# Patient Record
Sex: Female | Born: 1990 | Hispanic: Yes | Marital: Single | Smoking: Never smoker
Health system: Southern US, Community
[De-identification: ages and names within clinical notes are randomized; demographics above are authoritative.]

## PROBLEM LIST (undated history)

## (undated) DIAGNOSIS — D649 Anemia, unspecified: Secondary | ICD-10-CM

---

## 2016-10-11 ENCOUNTER — Other Ambulatory Visit: Payer: Self-pay | Admitting: Primary Care

## 2016-10-11 DIAGNOSIS — R1084 Generalized abdominal pain: Secondary | ICD-10-CM

## 2016-10-18 ENCOUNTER — Ambulatory Visit
Admission: RE | Admit: 2016-10-18 | Discharge: 2016-10-18 | Disposition: A | Discharge status: Home / Self Care | Payer: BLUE CROSS/BLUE SHIELD | Source: Ambulatory Visit | Attending: Primary Care | Admitting: Primary Care

## 2016-10-18 DIAGNOSIS — R1084 Generalized abdominal pain: Secondary | ICD-10-CM | POA: Diagnosis present

## 2017-03-05 ENCOUNTER — Emergency Department
Admission: EM | Admit: 2017-03-05 | Discharge: 2017-03-05 | Disposition: A | Discharge status: Home / Self Care | Payer: BLUE CROSS/BLUE SHIELD | Attending: Emergency Medicine | Admitting: Emergency Medicine

## 2017-03-05 ENCOUNTER — Encounter: Payer: Self-pay | Admitting: Emergency Medicine

## 2017-03-05 DIAGNOSIS — Y9389 Activity, other specified: Secondary | ICD-10-CM | POA: Diagnosis not present

## 2017-03-05 DIAGNOSIS — X500XXA Overexertion from strenuous movement or load, initial encounter: Secondary | ICD-10-CM | POA: Diagnosis not present

## 2017-03-05 DIAGNOSIS — Y990 Civilian activity done for income or pay: Secondary | ICD-10-CM | POA: Insufficient documentation

## 2017-03-05 DIAGNOSIS — S4991XA Unspecified injury of right shoulder and upper arm, initial encounter: Secondary | ICD-10-CM | POA: Diagnosis present

## 2017-03-05 DIAGNOSIS — S46919A Strain of unspecified muscle, fascia and tendon at shoulder and upper arm level, unspecified arm, initial encounter: Secondary | ICD-10-CM | POA: Diagnosis not present

## 2017-03-05 DIAGNOSIS — S46911A Strain of unspecified muscle, fascia and tendon at shoulder and upper arm level, right arm, initial encounter: Secondary | ICD-10-CM

## 2017-03-05 DIAGNOSIS — Y9289 Other specified places as the place of occurrence of the external cause: Secondary | ICD-10-CM | POA: Insufficient documentation

## 2017-03-05 LAB — POCT PREGNANCY, URINE: Preg Test, Ur: NEGATIVE

## 2017-03-05 MED ORDER — HYDROCODONE-ACETAMINOPHEN 5-325 MG PO TABS: 1.0000 | ORAL_TABLET | Freq: Four times a day (QID) | ORAL | 0 refills | Status: DC | PRN

## 2017-03-05 MED ORDER — HYDROCODONE-ACETAMINOPHEN 5-325 MG PO TABS
1.0000 | ORAL_TABLET | Freq: Once | ORAL | Status: AC
  Administered 2017-03-05: 1 via ORAL
  Filled 2017-03-05: qty 1

## 2017-03-05 MED ORDER — METHOCARBAMOL 500 MG PO TABS: 500.0000 mg | ORAL_TABLET | Freq: Four times a day (QID) | ORAL | 0 refills | Status: AC

## 2017-03-05 MED ORDER — ETODOLAC 400 MG PO TABS: 400.0000 mg | ORAL_TABLET | Freq: Two times a day (BID) | ORAL | 0 refills | Status: DC

## 2017-03-05 MED ORDER — KETOROLAC TROMETHAMINE 30 MG/ML IJ SOLN
30.0000 mg | Freq: Once | INTRAMUSCULAR | Status: AC
Start: 2017-03-05 — End: 2017-03-05
  Administered 2017-03-05: 30 mg via INTRAMUSCULAR
  Filled 2017-03-05: qty 1

## 2017-03-05 MED ORDER — METHOCARBAMOL 500 MG PO TABS
1000.0000 mg | ORAL_TABLET | Freq: Once | ORAL | Status: AC
  Administered 2017-03-05: 1000 mg via ORAL
  Filled 2017-03-05: qty 2

## 2017-03-05 NOTE — ED Notes (Addendum)
See triage note  Presents with mid back pain  States pain radiates from posterior right shoulder and goes to mid back and chest  Discomfort to chest is caused by taking deep breaths   Does a lot of heavy lifting   Ambulates well   No urinary problems

## 2017-03-05 NOTE — ED Provider Notes (Signed)
[facility] Emergency Department Provider Note  ___________________________________________   First MD Initiated Contact with Patient 03/05/17 1320     (approximate)  I have reviewed the triage vital signs and the nursing notes.   HISTORY  Chief Complaint Back Pain   HPI Tonya Webb is a 26 y.o. female is here with complaint of right upper back pain suggest a. Patient states the pain goes through her right shoulder and right arm and is worse with movement. 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 This is the second time she has had this same muscle injury. Patient states that pain is worse today. She denies any difficulty breathing or any heart issues. Patient has been taking ibuprofen without any relief. Currently she rates her pain as a 10 over 10.   History reviewed. No pertinent past medical history.  There are no active problems to display for this patient.   History reviewed. No pertinent surgical history.  Prior to Admission medications   Medication Sig Start Date End Date Taking? Authorizing Provider  etodolac (LODINE) 400 MG tablet Take 1 tablet (400 mg total) by mouth 2 (two) times daily. With food 03/05/17   Tommi Rumps, PA-C  HYDROcodone-acetaminophen (NORCO/VICODIN) 5-325 MG tablet Take 1 tablet by mouth every 6 (six) hours as needed for moderate pain. 03/05/17   Tommi Rumps, PA-C  methocarbamol (ROBAXIN) 500 MG tablet Take 1 tablet (500 mg total) by mouth 4 (four) times daily. 03/05/17 03/07/17  Tommi Rumps, PA-C    Allergies Patient has no known allergies.  History reviewed. No pertinent family history.  Social History Social History  Substance Use Topics  . Smoking status: Never Smoker  . Smokeless tobacco: Never Used  . Alcohol use No    Review of Systems Constitutional: No fever/chills Cardiovascular: Denies chest pain. Respiratory: Denies shortness of  breath. Gastrointestinal:   No nausea, no vomiting.  Musculoskeletal: Pain in her right upper back and shoulder. Skin: Negative for rash. Neurological: Negative for  focal weakness or numbness. ___________________________________________   PHYSICAL EXAM:  VITAL SIGNS: ED Triage Vitals  Enc Vitals Group     BP 03/05/17 1254 117/81     Pulse Rate 03/05/17 1254 72     Resp 03/05/17 1254 18     Temp 03/05/17 1254 98.1 F (36.7 C)     Temp Source 03/05/17 1254 Oral     SpO2 03/05/17 1254 100 %     Weight 03/05/17 1302 135 lb (61.2 kg)     Height 03/05/17 1302  (1.499 m)     Head Circumference --      Peak Flow --      Pain Score 03/05/17 1302 10     Pain Loc --      Pain Edu? --      Excl. in GC? --    Constitutional: Alert and oriented. Well appearing and in no acute distress. Eyes: Conjunctivae are normal.  Head: Atraumatic. Neck: No stridor.   Cardiovascular: Normal rate, regular rhythm. Grossly normal heart sounds.  Good peripheral circulation. Respiratory: Normal respiratory effort.  No retractions. Lungs CTAB. Musculoskeletal: Examination of the right upper shoulder there is no gross deformity and no difficulty with range of motion passively. No crepitus is noted. There is marked tenderness on palpation of the soft tissues of the trapezius, right rhomboid, and deltoid. Patient is nontender on palpation of the cervical or thoracic spine. Grip strength is equal.  There is no ecchymosis, abrasions or erythema noted.  Neurologic:  Normal speech and language. No gross focal neurologic deficits are appreciated. No gait instability. Skin:  Skin is warm, dry and intact. No ecchymosis, abrasions or erythema noted. Psychiatric: Mood and affect are normal. Speech and behavior are normal.  ____________________________________________   LABS (all labs ordered are listed, but only abnormal results are displayed)  Labs Reviewed  POCT PREGNANCY, URINE  POC URINE PREG, ED    _ PROCEDURES  Procedure(s) performed: None  Procedures  Critical Care performed: No  ____________________________________________   INITIAL IMPRESSION / ASSESSMENT AND PLAN / ED COURSE  Pertinent labs & imaging results that were available during my care of the patient were reviewed by me and considered in my medical decision making (see chart for details).  Patient improved with medications given in the ED. She received Toradol 30 mg IM, Robaxin thousand milligrams by mouth and Norco one tablet by mouth. Patient was able to move her shoulder and had less pain. Patient was given medication to take at home along with a work note for the next 2 days. She is made aware that she could not take any medications other than the etodolac if she plans to drive due to possible drowsiness.  Patient agrees to follow-up with [facility] if any continued problems.   ___________________________________________   FINAL CLINICAL IMPRESSION(S) / ED DIAGNOSES  Final diagnoses:  Muscle strain of right upper arm, initial encounter  Muscle strain of right shoulder region, initial encounter      NEW MEDICATIONS STARTED DURING THIS VISIT:  Discharge Medication List as of 03/05/2017  3:04 PM    START taking these medications   Details  etodolac (LODINE) 400 MG tablet Take 1 tablet (400 mg total) by mouth 2 (two) times daily. With food, Starting Sun 03/05/2017, Print    HYDROcodone-acetaminophen (NORCO/VICODIN) 5-325 MG tablet Take 1 tablet by mouth every 6 (six) hours as needed for moderate pain., Starting Sun 03/05/2017, Print    methocarbamol (ROBAXIN) 500 MG tablet Take 1 tablet (500 mg total) by mouth 4 (four) times daily., Starting Sun 03/05/2017, Until Tue 03/07/2017, Print         Note:  This document was prepared using Dragon voice recognition software and may include unintentional dictation errors.    Tommi Rumps, PA-C 03/05/17 1514    Governor Rooks, MD 03/05/17  [phone]

## 2017-03-05 NOTE — Discharge Instructions (Signed)
Follow-up with your primary care provider at [facility] clinic. Continue medications as directed. You may use ice or heat to your shoulder and upper back as needed for comfort. Do not take Norco for Robaxin and drive or operate machinery. You may take etodolac 400 mg twice a day with food on any day as this does not cause any drowsiness. Routine medications you have a prescription for 2 days only. Today you are given a note to remain out of work until 03/07/17. Limited duty for the next 4 days of no lifting over 25 pounds.

## 2017-03-05 NOTE — ED Triage Notes (Signed)
Pt c/o right upper back pain since yesterday.  Pain also goes through right shoulder and right arm.  All pain worse with movement and extension right arm.  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

## 2019-01-29 ENCOUNTER — Other Ambulatory Visit: Payer: Self-pay

## 2019-01-29 DIAGNOSIS — Z20822 Contact with and (suspected) exposure to covid-19: Secondary | ICD-10-CM

## 2019-01-30 LAB — NOVEL CORONAVIRUS, NAA: SARS-CoV-2, NAA: NOT DETECTED

## 2019-06-14 NOTE — L&D Delivery Note (Signed)
Delivery Note  First Stage: Labor onset: 1100 Augmentation : None Analgesia /Anesthesia intrapartum: None SROM at 1600  Second Stage: Complete dilation at 1555 Onset of pushing at 1555 FHR second stage 145 bpm with moderate variability, accels present, intermittent variables  Tonya Webb presented to L&D in advance labor.  She had a spontaneous urge to push after SROM.  She pushed effectively with contractions for a spontaneous vaginal birth.   Delivery of a viable baby boy on 02/14/2020 at 1606 by CNM Delivery of fetal head in OA position with restitution to LOT. No nuchal cord;  Anterior then posterior shoulders delivered easily with gentle downward traction. Baby placed on mom's chest, and attended to by baby RN Cord double clamped after cessation of pulsation, cut by father of baby.  Cord blood sample collected d/t Rh unknown   Third Stage: Oxytocin bolus started after delivery of infant for hemorrhage prophylaxis  Placenta delivered intact with 3 VC @ 1612 Placenta disposition: discarded Uterine tone firm / bleeding moderate  1st degree perineal laceration identified  Anesthesia for repair: 1% lidocaine for local  Repair with 2-0 chromic CT in usual fashion  Est. Blood Loss (mL): 100  Complications: None  Mom to postpartum.  Baby to Couplet care / Skin to Skin.  Newborn: Information for the patient's newborn:  Tonya Webb, [name] [[id number]]  Live born female  Birth Weight: 7 lb 0.5 oz (3190 g) APGAR: 8, 9  Newborn Delivery   Birth date/time: 02/14/2020 16:06:00 Delivery type: Vaginal, Spontaneous     Feeding planned: breast   ---------- Margaretmary Eddy, CNM Certified Nurse Midwife [place]  [facility]

## 2019-07-25 LAB — OB RESULTS CONSOLE RUBELLA ANTIBODY, IGM: Rubella: NON-IMMUNE/NOT IMMUNE

## 2019-07-25 LAB — OB RESULTS CONSOLE HIV ANTIBODY (ROUTINE TESTING): HIV: NONREACTIVE

## 2019-07-25 LAB — OB RESULTS CONSOLE GC/CHLAMYDIA
Chlamydia: NEGATIVE
Gonorrhea: NEGATIVE

## 2019-07-25 LAB — OB RESULTS CONSOLE VARICELLA ZOSTER ANTIBODY, IGG: Varicella: IMMUNE

## 2019-07-25 LAB — OB RESULTS CONSOLE HEPATITIS B SURFACE ANTIGEN: Hepatitis B Surface Ag: NEGATIVE

## 2019-07-25 LAB — OB RESULTS CONSOLE RPR: RPR: NONREACTIVE

## 2019-09-27 ENCOUNTER — Other Ambulatory Visit (HOSPITAL_COMMUNITY): Payer: Self-pay | Admitting: Primary Care

## 2019-09-27 ENCOUNTER — Other Ambulatory Visit: Payer: Self-pay | Admitting: Primary Care

## 2019-09-27 DIAGNOSIS — Z3482 Encounter for supervision of other normal pregnancy, second trimester: Secondary | ICD-10-CM

## 2019-10-04 ENCOUNTER — Ambulatory Visit
Admission: RE | Admit: 2019-10-04 | Discharge: 2019-10-04 | Disposition: A | Discharge status: Home / Self Care | Payer: Medicaid Other | Source: Ambulatory Visit | Attending: Primary Care | Admitting: Primary Care

## 2019-10-04 ENCOUNTER — Other Ambulatory Visit: Payer: Self-pay

## 2019-10-04 DIAGNOSIS — Z3482 Encounter for supervision of other normal pregnancy, second trimester: Secondary | ICD-10-CM | POA: Diagnosis present

## 2019-11-08 ENCOUNTER — Other Ambulatory Visit: Payer: Self-pay | Admitting: Primary Care

## 2019-11-08 DIAGNOSIS — Z3482 Encounter for supervision of other normal pregnancy, second trimester: Secondary | ICD-10-CM

## 2019-11-21 ENCOUNTER — Ambulatory Visit
Admission: RE | Admit: 2019-11-21 | Discharge: 2019-11-21 | Disposition: A | Discharge status: Home / Self Care | Payer: Medicaid Other | Source: Ambulatory Visit | Attending: Primary Care | Admitting: Primary Care

## 2019-11-21 ENCOUNTER — Other Ambulatory Visit: Payer: Self-pay

## 2019-11-21 DIAGNOSIS — Z3482 Encounter for supervision of other normal pregnancy, second trimester: Secondary | ICD-10-CM

## 2020-01-30 LAB — OB RESULTS CONSOLE GBS: GBS: NEGATIVE

## 2020-02-14 ENCOUNTER — Other Ambulatory Visit: Payer: Self-pay

## 2020-02-14 ENCOUNTER — Inpatient Hospital Stay
Admission: EM | Admit: 2020-02-14 | Discharge: 2020-02-15 | DRG: 807 | Disposition: A | Discharge status: Home / Self Care | Payer: Medicaid Other

## 2020-02-14 DIAGNOSIS — O26893 Other specified pregnancy related conditions, third trimester: Secondary | ICD-10-CM | POA: Diagnosis present

## 2020-02-14 DIAGNOSIS — Z3A39 39 weeks gestation of pregnancy: Secondary | ICD-10-CM | POA: Diagnosis not present

## 2020-02-14 DIAGNOSIS — Z23 Encounter for immunization: Secondary | ICD-10-CM

## 2020-02-14 DIAGNOSIS — Z20822 Contact with and (suspected) exposure to covid-19: Secondary | ICD-10-CM | POA: Diagnosis present

## 2020-02-14 LAB — TYPE AND SCREEN
ABO/RH(D): A POS
Antibody Screen: NEGATIVE

## 2020-02-14 LAB — CBC
HCT: 36.5 % (ref 36.0–46.0)
Hemoglobin: 13 g/dL (ref 12.0–15.0)
MCH: 29.6 pg (ref 26.0–34.0)
MCHC: 35.6 g/dL (ref 30.0–36.0)
MCV: 83.1 fL (ref 80.0–100.0)
Platelets: 181 10*3/uL (ref 150–400)
RBC: 4.39 MIL/uL (ref 3.87–5.11)
RDW: 13.8 % (ref 11.5–15.5)
WBC: 12.6 10*3/uL — ABNORMAL HIGH (ref 4.0–10.5)
nRBC: 0 % (ref 0.0–0.2)

## 2020-02-14 LAB — ABO/RH: ABO/RH(D): A POS

## 2020-02-14 LAB — SARS CORONAVIRUS 2 BY RT PCR (HOSPITAL ORDER, PERFORMED IN ~~LOC~~ HOSPITAL LAB): SARS Coronavirus 2: NEGATIVE

## 2020-02-14 MED ORDER — COCONUT OIL OIL: 1.0000 "application " | TOPICAL_OIL | Status: DC | PRN

## 2020-02-14 MED ORDER — ACETAMINOPHEN 500 MG PO TABS
1000.0000 mg | ORAL_TABLET | Freq: Four times a day (QID) | ORAL | Status: DC | PRN
  Administered 2020-02-14: 1000 mg via ORAL
  Filled 2020-02-14: qty 2

## 2020-02-14 MED ORDER — OXYTOCIN-SODIUM CHLORIDE 30-0.9 UT/500ML-% IV SOLN
INTRAVENOUS | Status: AC
  Administered 2020-02-14: 333 mL via INTRAVENOUS
  Filled 2020-02-14: qty 1000

## 2020-02-14 MED ORDER — BENZOCAINE-MENTHOL 20-0.5 % EX AERO
INHALATION_SPRAY | CUTANEOUS | Status: AC
  Filled 2020-02-14: qty 56

## 2020-02-14 MED ORDER — LACTATED RINGERS IV SOLN: INTRAVENOUS | Status: DC

## 2020-02-14 MED ORDER — DOCUSATE SODIUM 100 MG PO CAPS
100.0000 mg | ORAL_CAPSULE | Freq: Two times a day (BID) | ORAL | Status: DC
  Administered 2020-02-15: 100 mg via ORAL
  Filled 2020-02-14 (×3): qty 1

## 2020-02-14 MED ORDER — IBUPROFEN 600 MG PO TABS
600.0000 mg | ORAL_TABLET | Freq: Four times a day (QID) | ORAL | Status: DC
  Administered 2020-02-14 – 2020-02-15 (×4): 600 mg via ORAL
  Filled 2020-02-14 (×4): qty 1

## 2020-02-14 MED ORDER — ACETAMINOPHEN 500 MG PO TABS: 1000.0000 mg | ORAL_TABLET | Freq: Four times a day (QID) | ORAL | Status: DC | PRN

## 2020-02-14 MED ORDER — LIDOCAINE HCL (PF) 1 % IJ SOLN
INTRAMUSCULAR | Status: AC
  Administered 2020-02-14: 30 mL via SUBCUTANEOUS
  Filled 2020-02-14: qty 30

## 2020-02-14 MED ORDER — AMMONIA AROMATIC IN INHA
RESPIRATORY_TRACT | Status: AC
  Filled 2020-02-14: qty 10

## 2020-02-14 MED ORDER — SOD CITRATE-CITRIC ACID 500-334 MG/5ML PO SOLN: 30.0000 mL | ORAL | Status: DC | PRN

## 2020-02-14 MED ORDER — WITCH HAZEL-GLYCERIN EX PADS: 1.0000 "application " | MEDICATED_PAD | CUTANEOUS | Status: DC

## 2020-02-14 MED ORDER — ONDANSETRON HCL 4 MG PO TABS: 4.0000 mg | ORAL_TABLET | ORAL | Status: DC | PRN

## 2020-02-14 MED ORDER — DIBUCAINE (PERIANAL) 1 % EX OINT: 1.0000 "application " | TOPICAL_OINTMENT | CUTANEOUS | Status: DC | PRN

## 2020-02-14 MED ORDER — MISOPROSTOL 200 MCG PO TABS
ORAL_TABLET | ORAL | Status: AC
  Filled 2020-02-14: qty 4

## 2020-02-14 MED ORDER — ONDANSETRON HCL 4 MG/2ML IJ SOLN: 4.0000 mg | INTRAMUSCULAR | Status: DC | PRN

## 2020-02-14 MED ORDER — OXYTOCIN BOLUS FROM INFUSION: 333.0000 mL | Freq: Once | INTRAVENOUS | Status: AC

## 2020-02-14 MED ORDER — OXYTOCIN 10 UNIT/ML IJ SOLN
INTRAMUSCULAR | Status: AC
  Filled 2020-02-14: qty 2

## 2020-02-14 MED ORDER — FENTANYL CITRATE (PF) 100 MCG/2ML IJ SOLN: 50.0000 ug | INTRAMUSCULAR | Status: DC | PRN

## 2020-02-14 MED ORDER — PRENATAL MULTIVITAMIN CH
1.0000 | ORAL_TABLET | Freq: Every day | ORAL | Status: DC
  Administered 2020-02-15: 1 via ORAL
  Filled 2020-02-14: qty 1

## 2020-02-14 MED ORDER — SIMETHICONE 80 MG PO CHEW: 80.0000 mg | CHEWABLE_TABLET | ORAL | Status: DC | PRN

## 2020-02-14 MED ORDER — DIPHENHYDRAMINE HCL 25 MG PO CAPS: 25.0000 mg | ORAL_CAPSULE | Freq: Four times a day (QID) | ORAL | Status: DC | PRN

## 2020-02-14 MED ORDER — ONDANSETRON HCL 4 MG/2ML IJ SOLN: 4.0000 mg | Freq: Four times a day (QID) | INTRAMUSCULAR | Status: DC | PRN

## 2020-02-14 MED ORDER — OXYTOCIN-SODIUM CHLORIDE 30-0.9 UT/500ML-% IV SOLN
2.5000 [IU]/h | INTRAVENOUS | Status: DC
  Administered 2020-02-14: 2.5 [IU]/h via INTRAVENOUS

## 2020-02-14 MED ORDER — LIDOCAINE HCL (PF) 1 % IJ SOLN: 30.0000 mL | INTRAMUSCULAR | Status: AC | PRN

## 2020-02-14 MED ORDER — BENZOCAINE-MENTHOL 20-0.5 % EX AERO: 1.0000 "application " | INHALATION_SPRAY | CUTANEOUS | Status: DC | PRN

## 2020-02-14 MED ORDER — LACTATED RINGERS IV SOLN: 500.0000 mL | INTRAVENOUS | Status: DC | PRN

## 2020-02-14 NOTE — H&P (Signed)
OB History & Physical   History of Present Illness:  Chief Complaint:   HPI:  Tonya Webb is a 29 y.o. G33P2002 female at [redacted]w[redacted]d dated by patient statement.  She presents to L&D for active labor  Reports active fetal movement  Contractions: every 2 to 3 minutes, started at 11am  LOF/SROM: denies  Vaginal bleeding: bloody show   Pregnancy Issues: 1. Prenatal records not available   Patient Active Problem List   Diagnosis Date Noted  . Labor and delivery indication for care or intervention 02/14/2020     Maternal Medical History:  History reviewed. No pertinent past medical history.  History reviewed. No pertinent surgical history.  No Known Allergies  Prior to Admission medications   Medication Sig Start Date End Date Taking? Authorizing Provider  etodolac (LODINE) 400 MG tablet Take 1 tablet (400 mg total) by mouth 2 (two) times daily. With food 03/05/17   Tommi Rumps, PA-C  HYDROcodone-acetaminophen (NORCO/VICODIN) 5-325 MG tablet Take 1 tablet by mouth every 6 (six) hours as needed for moderate pain. 03/05/17   Tommi Rumps, PA-C     Prenatal care site:  [facility]  Social History: She  reports that she has never smoked. She has never used smokeless tobacco. She reports that she does not drink alcohol and does not use drugs.  Family History: family history is not on file.   Review of Systems: A full review of systems was performed and negative except as noted in the HPI.     Physical Exam:  Vital Signs: BP 111/77   Pulse 78   Temp 98.4 F (36.9 C) (Oral)   Resp 18   Ht 4\' 11"  (1.499 m)   Wt 71.2 kg   Breastfeeding Unknown   BMI 31.71 kg/m  Physical Exam  General: no acute distress.  HEENT: normocephalic, atraumatic Heart: regular rate & rhythm.  No murmurs/rubs/gallops Lungs: clear to auscultation bilaterally, normal respiratory effort Abdomen: soft, gravid, non-tender;  EFW: 6 1/2 lbs  Pelvic:   External: Normal external female  genitalia  Cervix: 10/100/+2/BBOW   Extremities: non-tender, symmetric, no edema bilaterally.  DTRs: 2+/2+  Neurologic: Alert & oriented x 3.    Results for orders placed or performed during the hospital encounter of 02/14/20 (from the past 24 hour(s))  CBC     Status: Abnormal   Collection Time: 02/14/20  3:57 PM  Result Value Ref Range   WBC 12.6 (H) 4.0 - 10.5 K/uL   RBC 4.39 3.87 - 5.11 MIL/uL   Hemoglobin 13.0 12.0 - 15.0 g/dL   HCT 04/15/20 36 - 46 %   MCV 83.1 80.0 - 100.0 fL   MCH 29.6 26.0 - 34.0 pg   MCHC 35.6 30.0 - 36.0 g/dL   RDW 60.1 09.3 - 23.5 %   Platelets 181 150 - 400 K/uL   nRBC 0.0 0.0 - 0.2 %  Type and screen [facility]     Status: None (Preliminary result)   Collection Time: 02/14/20  3:57 PM  Result Value Ref Range   ABO/RH(D) PENDING    Antibody Screen PENDING    Sample Expiration      02/17/2020,2359 Performed at [facility], [address], [place], [place]     Pertinent Results:  Prenatal Labs: Prenatal labs not available - labs pending    FHT: Baseline: 145 bpm, Variability: moderate, Accelerations: present and Decelerations: Variable: intermittent  TOCO: regular, every 2-3 minutes   Cephalic by  [place] and SVE   No results found.  Assessment:  Tonya Webb is a 29 y.o. G54P2002 female at [redacted]w[redacted]d with active labor .   Plan:  1. Admit to Labor & Delivery; consents reviewed and obtained - Covid admission screen  - Prep room for immanent delivery   2. Fetal Well being  - Fetal Tracing:  - Group B Streptococcus ppx not indicated: GBS neg - Presentation: cephalic confirmed by SVE   3. Routine OB: - Prenatal labs reviewed, as above - Rh unknown - ABO pending  - CBC, T&S, RPR on admit - Prenatal labs not available, has received prenatal care from [facility]  - Clear fluids, IVF  4. Monitoring of labor  -  Contractions monitored with external toco -  Pelvis adequate for trial of  labor  -  Plan for expectant management  -  Plan for  continuous fetal monitoring -  Maternal pain control as desired; planning unmedicated labor support options  - Anticipate vaginal delivery  5. Post Partum Planning: - Infant feeding: breast - Contraception: TBD  Gustavo Lah, CNM 02/14/20 4:58 PM  Margaretmary Eddy, CNM Certified Nurse Midwife [place]  [facility]

## 2020-02-14 NOTE — Discharge Summary (Signed)
Obstetrical Discharge Summary  Patient Name: Tonya Webb DOB: [birth year] MRN: [id number]  Date of Admission: 02/14/2020 Date of Delivery: 02/14/2020 Delivered by: Drinda Butts, CNM  Date of Discharge: 02/15/2020  Primary OB: [facility] LMP:No LMP recorded. EDC Estimated Date of Delivery: 02/20/20 Gestational Age at Delivery: [redacted]w[redacted]d  Antepartum complications:  1. Prenatal records not available - able to obtain labs from [facility]  Admitting Diagnosis: active labor  Secondary Diagnosis: Patient Active Problem List   Diagnosis Date Noted  . Labor and delivery indication for care or intervention 02/14/2020    Augmentation: N/A Complications: None Intrapartum complications/course: [place]ern[place]cepresented to L&D in advance labor.  She had a spontaneous urge to push after SROM.   Delivery Type: spontaneous vaginal delivery Anesthesia: None  Placenta: spontaneous Laceration: 1st degree perineal with repair  Episiotomy: none Newborn Data: Live born female  Birth Weight:  7lbs 0.5oz (3190g) APGAR: 8, 9  Newborn Delivery   Birth date/time: 02/14/2020 16:06:00 Delivery type: Vaginal, Spontaneous     Postpartum Procedures: none  Edinburgh:  Edinburgh Postnatal Depression Scale Screening Tool 02/15/2020  I have been able to laugh and see the funny side of things. 0  I have looked forward with enjoyment to things. 0  I have blamed myself unnecessarily when things went wrong. 1  I have been anxious or worried for no good reason. 1  I have felt scared or panicky for no good reason. 1  Things have been getting on top of me. 1  I have been so unhappy that I have had difficulty sleeping. 1  I have felt sad or miserable. 0  I have been so unhappy that I have been crying. 1  The thought of harming myself has occurred to me. 0  Edinburgh Postnatal Depression Scale Total 6     Post partum course:  Patient had an uncomplicated postpartum course.  By time of discharge on PPD#1, her pain  was controlled on oral pain medications; she had appropriate lochia and was ambulating, voiding without difficulty and tolerating regular diet.  She was deemed stable for discharge to home.    Discharge Physical Exam:  BP 102/74 (BP Location: Right Arm)   Pulse 73   Temp 98.3 F (36.8 C) (Oral)   Resp 18   Ht '4\' 11"'  (1.499 m)   Wt 71.2 kg   SpO2 100%   Breastfeeding Unknown   BMI 31.71 kg/m   General: NAD CV: RRR Pulm: CTABL, nl effort ABD: s/nd/nt, fundus firm and below the umbilicus Lochia: moderate Perineum:minimal edema/repair well approximated DVT Evaluation: LE non-ttp, no evidence of DVT on exam.  Hemoglobin  Date Value Ref Range Status  02/15/2020 11.5 (L) 12.0 - 15.0 g/dL Final   HCT  Date Value Ref Range Status  02/15/2020 33.4 (L) 36 - 46 % Final     Disposition: stable, discharge to home. Baby Feeding: breastmilk Baby Disposition: home with mom  Rh Immune globulin given: Rh pos Rubella vaccine given: non-immune - MMR postpartum  Varivax vaccine given: immune Flu vaccine given in AP or PP setting: due in season Tdap vaccine given in AP or PP setting: Unknown   Contraception: BTL - will complete 4 weeks postpartum   Prenatal Labs:  Blood type/Rh A pos  Antibody screen neg  Rubella Non-immune  Varicella Immune  RPR NR  HBsAg Neg  HIV NR  GC neg  Chlamydia neg  Genetic screening Unknown   1 hour GTT Unknown   3 hour  GTT Unknown   GBS Negative    Plan:  Tonya Webb was discharged to home in good condition. Follow-up appointment with Dr. Shary Decamp for pre-operative for bilateral tubal ligation in 2 weeks.  Follow-up with delivering provider in 6 weeks.  Discharge Medications: Allergies as of 02/15/2020   No Known Allergies     Medication List    TAKE these medications   acetaminophen 500 MG tablet Commonly known as: TYLENOL Take 2 tablets (1,000 mg total) by mouth every 6 (six) hours as needed (for pain scale < 4).   docusate  sodium 100 MG capsule Commonly known as: COLACE Take 1 capsule (100 mg total) by mouth 2 (two) times daily.   ibuprofen 600 MG tablet Commonly known as: ADVIL Take 1 tablet (600 mg total) by mouth every 6 (six) hours.   prenatal multivitamin Tabs tablet Take 1 tablet by mouth daily at 12 noon.   witch hazel-glycerin pad Commonly known as: TUCKS Apply 1 application topically continuous.        Follow-up Information    Benjaman Kindler, MD. Schedule an appointment as soon as possible for a visit in 2 week(s).   Specialty: Obstetrics and Gynecology Why: for pre-op appointment for BTL  Contact information: [place] [address] [phone]        Minda Meo, CNM. Schedule an appointment as soon as possible for a visit in 6 week(s).   Specialty: Certified Nurse Midwife Why: postpartum visit  Contact information: [place] [address] [phone]               Signed: Francetta Found, CNM 02/15/2020 1:17 PM

## 2020-02-15 LAB — CBC
HCT: 33.4 % — ABNORMAL LOW (ref 36.0–46.0)
Hemoglobin: 11.5 g/dL — ABNORMAL LOW (ref 12.0–15.0)
MCH: 29.8 pg (ref 26.0–34.0)
MCHC: 34.4 g/dL (ref 30.0–36.0)
MCV: 86.5 fL (ref 80.0–100.0)
Platelets: 139 10*3/uL — ABNORMAL LOW (ref 150–400)
RBC: 3.86 MIL/uL — ABNORMAL LOW (ref 3.87–5.11)
RDW: 14 % (ref 11.5–15.5)
WBC: 11.7 10*3/uL — ABNORMAL HIGH (ref 4.0–10.5)
nRBC: 0 % (ref 0.0–0.2)

## 2020-02-15 LAB — RPR: RPR Ser Ql: NONREACTIVE

## 2020-02-15 MED ORDER — MEASLES, MUMPS & RUBELLA VAC IJ SOLR
0.5000 mL | Freq: Once | INTRAMUSCULAR | Status: AC
  Administered 2020-02-15: 0.5 mL via SUBCUTANEOUS
  Filled 2020-02-15 (×2): qty 0.5

## 2020-02-15 MED ORDER — IBUPROFEN 600 MG PO TABS: 600.0000 mg | ORAL_TABLET | Freq: Four times a day (QID) | ORAL | 0 refills | Status: DC

## 2020-02-15 MED ORDER — PRENATAL MULTIVITAMIN CH: 1.0000 | ORAL_TABLET | Freq: Every day | ORAL | Status: AC

## 2020-02-15 MED ORDER — DOCUSATE SODIUM 100 MG PO CAPS: 100.0000 mg | ORAL_CAPSULE | Freq: Two times a day (BID) | ORAL | 0 refills | Status: AC

## 2020-02-15 MED ORDER — WITCH HAZEL-GLYCERIN EX PADS: 1.0000 "application " | MEDICATED_PAD | CUTANEOUS | 12 refills | Status: AC

## 2020-02-15 MED ORDER — ACETAMINOPHEN 500 MG PO TABS: 1000.0000 mg | ORAL_TABLET | Freq: Four times a day (QID) | ORAL | 0 refills | Status: DC | PRN

## 2020-02-15 NOTE — Discharge Instructions (Signed)
Vaginal Delivery, Care After Refer to this sheet in the next few weeks. These discharge instructions provide you with information on caring for yourself after delivery. Your caregiver may also give you specific instructions. Your treatment has been planned according to the most current medical practices available, but problems sometimes occur. Call your caregiver if you have any problems or questions after you go home. HOME CARE INSTRUCTIONS 1. Take over-the-counter or prescription medicines only as directed by your caregiver or pharmacist. 2. Do not drink alcohol, especially if you are breastfeeding or taking medicine to relieve pain. 3. Do not smoke tobacco. 4. Continue to use good perineal care. Good perineal care includes: 1. Wiping your perineum from back to front 2. Keeping your perineum clean. 3. You can do sitz baths twice a day, to help keep this area clean 5. Do not use tampons, douche or have sex until your caregiver says it is okay. 6. Shower only and avoid sitting in submerged water, aside from sitz baths 7. Wear a well-fitting bra that provides breast support. 8. Eat healthy foods. 9. Drink enough fluids to keep your urine clear or pale yellow. 10. Eat high-fiber foods such as whole grain cereals and breads, brown rice, beans, and fresh fruits and vegetables every day. These foods may help prevent or relieve constipation. 11. Avoid constipation with high fiber foods or medications, such as miralax or metamucil 12. Follow your caregiver's recommendations regarding resumption of activities such as climbing stairs, driving, lifting, exercising, or traveling. 13. Talk to your caregiver about resuming sexual activities. Resumption of sexual activities is dependent upon your risk of infection, your rate of healing, and your comfort and desire to resume sexual activity. 14. Try to have someone help you with your household activities and your newborn for at least a few days after you leave  the hospital. 15. Rest as much as possible. Try to rest or take a nap when your newborn is sleeping. 16. Increase your activities gradually. 17. Keep all of your scheduled postpartum appointments. It is very important to keep your scheduled follow-up appointments. At these appointments, your caregiver will be checking to make sure that you are healing physically and emotionally. SEEK MEDICAL CARE IF:   You are passing large clots from your vagina. Save any clots to show your caregiver.  You have a foul smelling discharge from your vagina.  You have trouble urinating.  You are urinating frequently.  You have pain when you urinate.  You have a change in your bowel movements.  You have increasing redness, pain, or swelling near your vaginal incision (episiotomy) or vaginal tear.  You have pus draining from your episiotomy or vaginal tear.  Your episiotomy or vaginal tear is separating.  You have painful, hard, or reddened breasts.  You have a severe headache.  You have blurred vision or see spots.  You feel sad or depressed.  You have thoughts of hurting yourself or your newborn.  You have questions about your care, the care of your newborn, or medicines.  You are dizzy or light-headed.  You have a rash.  You have nausea or vomiting.  You were breastfeeding and have not had a menstrual period within 12 weeks after you stopped breastfeeding.  You are not breastfeeding and have not had a menstrual period by the 12th week after delivery.  You have a fever. SEEK IMMEDIATE MEDICAL CARE IF:   You have persistent pain.  You have chest pain.  You have shortness of breath.    You faint.  You have leg pain.  You have stomach pain.  Your vaginal bleeding saturates two or more sanitary pads in 1 hour. MAKE SURE YOU:   Understand these instructions.  Will watch your condition.  Will get help right away if you are not doing well or get worse. Document Released:  05/27/2000 Document Revised: 10/14/2013 Document Reviewed: 01/25/2012 ExitCare Patient Information 2015 [employer]. This information is not intended to replace advice given to you by your health care provider. Make sure you discuss any questions you have with your health care provider.  Sitz Bath A sitz bath is a warm water bath taken in the sitting position. The water covers only the hips and butt (buttocks). We recommend using one that fits in the toilet, to help with ease of use and cleanliness. It may be used for either healing or cleaning purposes. Sitz baths are also used to relieve pain, itching, or muscle tightening (spasms). The water may contain medicine. Moist heat will help you heal and relax.  HOME CARE  Take 3 to 4 sitz baths a day. 18. Fill the bathtub half-full with warm water. 19. Sit in the water and open the drain a little. 20. Turn on the warm water to keep the tub half-full. Keep the water running constantly. 21. Soak in the water for 15 to 20 minutes. 22. After the sitz bath, pat the affected area dry. GET HELP RIGHT AWAY IF: You get worse instead of better. Stop the sitz baths if you get worse. MAKE SURE YOU:  Understand these instructions.  Will watch your condition.  Will get help right away if you are not doing well or get worse. Document Released: 07/07/2004 Document Revised: 02/22/2012 Document Reviewed: 09/27/2010 ExitCare Patient Information 2015 [employer]. This information is not intended to replace advice given to you by your health care provider. Make sure you discuss any questions you have with your health care provider.    

## 2020-02-15 NOTE — Progress Notes (Signed)
Discharge instructions, prescriptions, education, and appointments given and explained. Pt verbalized understanding with no further questions. Pt wheeled to personal vehicle with infant via staff.

## 2020-02-15 NOTE — Progress Notes (Signed)
Post Partum Day 1 Subjective: Doing well, no complaints.  Tolerating regular diet, pain with PO meds, voiding and ambulating without difficulty.  No CP SOB Fever,Chills, N/V or leg pain; denies nipple or breast pain, no HA change of vision, RUQ/epigastric pain  Objective: BP 102/74 (BP Location: Right Arm)   Pulse 73   Temp 98.3 F (36.8 C) (Oral)   Resp 18   Ht _0  (1.499 m)   Wt 71.2 kg   SpO2 100%   Breastfeeding Unknown   BMI 31.71 kg/m    Physical Exam:  General: NAD Breasts: soft/nontender CV: RRR Pulm: nl effort, CTABL Abdomen: soft, NT, BS x 4 Perineum: minimal edema Lochia: small Uterine Fundus: fundus firm and 1 fb below umbilicus DVT Evaluation: no cords, ttp LEs   Recent Labs    02/14/20 1557 02/15/20 0451  HGB 13.0 11.5*  HCT 36.5 33.4*  WBC 12.6* 11.7*  PLT 181 139*    Assessment/Plan: 29 y.o. G3P3003 postpartum day # 1  - Continue routine PP care - Lactation consult prn.  - Discussed contraceptive options including implant, IUDs hormonal and non-hormonal, injection, pills/ring/patch, condoms, and NFP. Desires BTL to be done.  - Immunization status: Needs MMR prior to DC   Disposition: Does desire Dc home today.     Francetta Found, CNM 02/15/2020  12:19 PM

## 2020-02-28 NOTE — H&P (Signed)
[name] is a 29 y.o. female here for L/S BTL  Pt is new to our clinic . Desires BTL . 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  Past Medical History:  has no past medical history on file.  Past Surgical History:  has no past surgical history on file. Family History: family history is not on file. Social History:  reports that she has never smoked. She has never used smokeless tobacco. She reports previous alcohol use. OB/GYN History:          OB History    Gravida  3   Para  3   Term      Preterm      AB  0   Living  3     SAB      TAB      Ectopic      Molar      Multiple      Live Births  3          Allergies: has No Known Allergies. Medications:  Current Outpatient Medications:  .  HYDROcodone-acetaminophen (NORCO) 5-325 mg tablet, Take 1 tablet by mouth every 4 (four) hours as needed for Pain POST OP 1-2 TABLETS EVERY 4 -6 HOURS PRN, Disp: 15 tablet, Rfl: 0 .  ibuprofen (MOTRIN) 800 MG tablet, Take 1 tablet (800 mg total) by mouth every 8 (eight) hours as needed for Pain, Disp: 30 tablet, Rfl: 1 .  ondansetron (ZOFRAN) 8 MG tablet, Take 1 tablet (8 mg total) by mouth every 8 (eight) hours as needed for Nausea, Disp: 10 tablet, Rfl: 0  Review of Systems: General:                      No fatigue or weight loss Eyes:                           No vision changes Ears:                            No hearing difficulty Respiratory:                No cough or shortness of breath Pulmonary:                  No asthma or shortness of breath Cardiovascular:           No chest pain, palpitations, dyspnea on exertion Gastrointestinal:          No abdominal bloating, chronic diarrhea, constipations, masses, pain or hematochezia Genitourinary:             No hematuria, dysuria, abnormal vaginal discharge, pelvic pain, Menometrorrhagia Lymphatic:                   No swollen lymph nodes Musculoskeletal:         No muscle  weakness Neurologic:                  No extremity weakness, syncope, seizure disorder Psychiatric:                  No history of depression, delusions or suicidal/homicidal ideation    Exam:      Vitals:   02/28/20 1438  BP: 123/88  Pulse: 64    Body mass index is 27.06 kg/m.  WDWN hispanic  female in NAD   Lungs: CTA  CV : RRR without murmur    Neck:  no thyromegaly Abdomen: soft , no mass, normal active bowel sounds,  non-tender, no rebound tenderness Pelvic: tanner stage 5 ,  External genitalia: vulva /labia no lesions Urethra: no prolapse Vagina: normal physiologic d/c Cervix: no lesions, no cervical motion tenderness   Uterus: normal size shape and contour, non-tender Adnexa: no mass,  non-tender   Rectovaginal:   Impression:   The encounter diagnosis was Contraceptive education.    Plan:   After discussion pt opts for elective L/S BTL - falope . Benefits and risks to surgery: The proposed benefit of the surgery has been discussed with the patient. The possible risks include, but are not limited to: organ injury to the bowel , bladder, ureters, and major blood vessels and nerves. There is a possibility of additional surgeries resulting from these injuries. There is also the risk of blood transfusion and the need to receive blood products during or after the procedure which may rarely lead to HIV or Hepatitis C infection. There is a risk of developing a deep venous thrombosis or a pulmonary embolism . There is the possibility of wound infection and also anesthetic complications, even the rare possibility of death. The patient understands these risks and wishes to proceed. All questions have been answered and the consent has been signed.     No follow-ups on file.  Vilma Prader, MD

## 2020-03-06 ENCOUNTER — Other Ambulatory Visit: Payer: Self-pay

## 2020-03-06 ENCOUNTER — Inpatient Hospital Stay: Admission: RE | Admit: 2020-03-06 | Payer: Medicaid Other | Source: Ambulatory Visit

## 2020-03-06 ENCOUNTER — Other Ambulatory Visit
Admission: RE | Admit: 2020-03-06 | Discharge: 2020-03-06 | Disposition: A | Discharge status: Home / Self Care | Payer: Medicaid Other | Source: Ambulatory Visit | Attending: Neurosurgery | Admitting: Neurosurgery

## 2020-03-06 DIAGNOSIS — Z01812 Encounter for preprocedural laboratory examination: Secondary | ICD-10-CM | POA: Diagnosis not present

## 2020-03-06 HISTORY — DX: Anemia, unspecified: D64.9

## 2020-03-06 LAB — BASIC METABOLIC PANEL
Anion gap: 9 (ref 5–15)
BUN: 7 mg/dL (ref 6–20)
CO2: 25 mmol/L (ref 22–32)
Calcium: 9.4 mg/dL (ref 8.9–10.3)
Chloride: 103 mmol/L (ref 98–111)
Creatinine, Ser: 0.69 mg/dL (ref 0.44–1.00)
GFR calc Af Amer: >60 mL/min (ref >60)
GFR calc non Af Amer: >60 mL/min (ref >60)
Glucose, Bld: 84 mg/dL (ref 70–99)
Potassium: 3.9 mmol/L (ref 3.5–5.1)
Sodium: 137 mmol/L (ref 135–145)

## 2020-03-06 LAB — CBC
HCT: 40.8 % (ref 36.0–46.0)
Hemoglobin: 13.5 g/dL (ref 12.0–15.0)
MCH: 28.7 pg (ref 26.0–34.0)
MCHC: 33.1 g/dL (ref 30.0–36.0)
MCV: 86.8 fL (ref 80.0–100.0)
Platelets: 215 10*3/uL (ref 150–400)
RBC: 4.7 MIL/uL (ref 3.87–5.11)
RDW: 12.4 % (ref 11.5–15.5)
WBC: 6.6 10*3/uL (ref 4.0–10.5)
nRBC: 0.3 % — ABNORMAL HIGH (ref 0.0–0.2)

## 2020-03-06 NOTE — Patient Instructions (Signed)
Your procedure is scheduled on: Monday October 4th, 2021 Su procedimiento est programado para: Lunes 4 de [place] del 2021. Report to Day Surgery inside [facility] 2nd floor.  Presntese a: Diplomatic Services operational officer de [employer]. To find out your arrival time please call [phone] between 1PM - 3PM on Friday March 13, 2020. Para saber su hora de llegada por favor llame al [phone] entre la 1PM - 3PM el WU:[id number] 1ro de [place], del  2021  Remember: Instructions that are not followed completely may result in serious medical risk, up to and including death,  or upon the discretion of your surgeon and anesthesiologist your surgery may need to be rescheduled.  Recuerde: Las instrucciones que no se siguen completamente Armed forces logistics/support/administrative officer en un riesgo de salud grave, incluyendo hasta  la [place] o a discrecin de su cirujano y Scientific laboratory technician, su ciruga se puede posponer.   __X_ 1.Do not eat food after midnight the night before your procedure. No    gum chewing or hard candies. You may drink clear liquids up to 2 hours     before you are scheduled to arrive for your surgery- DO not drink clear     Liquids within 2 hours of the start of your surgery.     Clear Liquids include:    water, apple juice without pulp, clear carbohydrate drink such as    Clearfast of [place], Black Coffee or Tea (Do not add anything to coffee or tea).      No coma nada despus de la medianoche de la noche anterior a su    procedimiento. No coma chicles ni caramelos duros. Puede tomar    lquidos claros hasta 2 horas antes de su hora programada de llegada al     hospital para su procedimiento. No tome lquidos claros durante el     transcurso de las 2 horas de su llegada programada al hospital para su     procedimiento, ya que esto puede llevar a que su procedimiento se    retrase o tenga que volver a Magazine features editor.  Los lquidos claros incluyen:          - Agua o jugo de [place] sin pulpa           - Bebidas claras con carbohidratos como ClearFast o Gatorade          - Caf negro o t claro (sin leche, sin cremas, no agregue nada al caf ni al t)  No tome nada que no est en esta lista.  Los pacientes con diabetes tipo 1 y tipo 2 solo deben Print[employer].  Llame a la clnica de [facility] o a la unidad de Same Day Surgery si  tiene alguna pregunta sobre estas instrucciones.              _X__ 2.Do Not Smoke or use e-cigarettes For 24 Hours Prior to Your Surgery.    Do not use any chewable tobacco products for at least 6   hours prior to surgery.    No fume ni use cigarrillos electrnicos durante las 24 horas previas    a su [place].  No use ningn producto de tabaco masticable durante   al menos 6 horas antes de la [place].     __X_ 3. No alcohol for 24 hours before or after surgery.    No tome alcohol durante las 24 horas antes ni despus de la [place].   ___x_4. Brush your teeth as normal, make sure not  to swallow any toothpaste or mouthwash. cepillese los [employer] lo have regularmente,                      asegurese de no tragar la pasta ni enjuague bucal.     __x__ 5. Notify your doctor if there is any change in your medical condition (cold,fever, infections).    Informe a su mdico si hay algn cambio en su condicin mdica  (resfriado, fiebre, infecciones).   Do not wear jewelry, make-up, hairpins, clips or nail polish.  No use joyas, maquillajes, pinzas/ganchos para el cabello ni esmalte de uas.  Do not wear lotions, powders, or perfumes. You may wear deodorant.  No use lociones, polvos o perfumes.  Puede usar desodorante.    Do not shave 48 hours prior to surgery. Men may shave face and neck.  No se afeite 48 horas antes de la [place].  Los hombres pueden [employer] cara  y el cuello.   Do not bring valuables to the hospital.   No lleve objetos de valor al hospital.  [facility] is not responsible for any belongings or valuables.  [place] no se hace  responsable de ningn tipo de pertenencias u objetos de Licensed conveyancer.               Contacts, dentures or bridgework may not be worn into surgery.  Los lentes de [place], las dentaduras postizas o puentes no se pueden usar en la [place].   Leave your suitcase in the car. After surgery it may be brought to your room.  Deje su maleta en el auto.  Despus de la ciruga podr traerla a su habitacin.   For patients admitted to the hospital, discharge time is determined by your  treatment team.  Para los pacientes que sean ingresados al hospital, el tiempo en el cual se le  dar de alta es determinado por su equipo de [place].   Patients discharged the day of surgery will not be allowed to drive home. A los pacientes que se les da de alta el mismo da de la ciruga no se les permitir conducir a Higher education careers adviser.   Please read over the following fact sheets that you were given: Por favor lea las siguientes hojas de informacin que le dieron:      ____ Take these medicines the morning of surgery with A SIP OF WATER:          [employer] medicinas la maana de la ciruga con UN SORBO DE AGUA:  1. None    __x__ Use CHG Soap as directed          Utilice el jabn de CHG segn lo indicado  __x__ Stop Anti-inflammatories such as Ibuprofen, Aleve, Advil, naproxen and or aspirin.           Deje de tomar antiinflamatorios como Ibuprofen, Aleve, Advil, naproxen and or aspirinas.    __x__ Stop supplements until after surgery            Deje de tomar suplementos hasta despus de la ciruga  __x__ Do not start any herbal supplements before your procedure.   No emieze a tomar suplementos de hierbas antes de su cirugia.

## 2020-03-08 LAB — TYPE AND SCREEN
ABO/RH(D): A POS
Antibody Screen: POSITIVE
Extend sample reason: UNDETERMINED
PT AG Type: POSITIVE

## 2020-03-12 ENCOUNTER — Other Ambulatory Visit
Admission: RE | Admit: 2020-03-12 | Discharge: 2020-03-12 | Disposition: A | Discharge status: Home / Self Care | Payer: Medicaid Other | Source: Ambulatory Visit | Attending: Obstetrics and Gynecology | Admitting: Obstetrics and Gynecology

## 2020-03-12 ENCOUNTER — Other Ambulatory Visit: Payer: Self-pay

## 2020-03-12 DIAGNOSIS — Z20822 Contact with and (suspected) exposure to covid-19: Secondary | ICD-10-CM | POA: Insufficient documentation

## 2020-03-12 DIAGNOSIS — Z01812 Encounter for preprocedural laboratory examination: Secondary | ICD-10-CM | POA: Diagnosis present

## 2020-03-12 LAB — SARS CORONAVIRUS 2 (TAT 6-24 HRS): SARS Coronavirus 2: NEGATIVE

## 2020-03-16 ENCOUNTER — Ambulatory Visit: Payer: Medicaid Other | Admitting: Certified Registered Nurse Anesthetist

## 2020-03-16 ENCOUNTER — Ambulatory Visit
Admission: RE | Admit: 2020-03-16 | Discharge: 2020-03-16 | Disposition: A | Discharge status: Home / Self Care | Payer: Medicaid Other | Attending: Obstetrics and Gynecology | Admitting: Obstetrics and Gynecology

## 2020-03-16 ENCOUNTER — Encounter
Admission: RE | Disposition: A | Discharge status: Home / Self Care | Payer: Self-pay | Source: Home / Self Care | Attending: Obstetrics and Gynecology

## 2020-03-16 ENCOUNTER — Encounter: Payer: Self-pay | Admitting: Obstetrics and Gynecology

## 2020-03-16 ENCOUNTER — Other Ambulatory Visit: Payer: Self-pay

## 2020-03-16 DIAGNOSIS — Z302 Encounter for sterilization: Secondary | ICD-10-CM | POA: Insufficient documentation

## 2020-03-16 HISTORY — PX: LAPAROSCOPIC TUBAL LIGATION: SHX1937

## 2020-03-16 LAB — POCT PREGNANCY, URINE: Preg Test, Ur: NEGATIVE

## 2020-03-16 SURGERY — LIGATION, FALLOPIAN TUBE, LAPAROSCOPIC
Anesthesia: General | Site: Abdomen | Laterality: Bilateral

## 2020-03-16 MED ORDER — LACTATED RINGERS IV SOLN: INTRAVENOUS | Status: DC

## 2020-03-16 MED ORDER — BUPIVACAINE HCL (PF) 0.5 % IJ SOLN
INTRAMUSCULAR | Status: AC
  Filled 2020-03-16: qty 30

## 2020-03-16 MED ORDER — CHLORHEXIDINE GLUCONATE 0.12 % MT SOLN
OROMUCOSAL | Status: AC
  Filled 2020-03-16: qty 15

## 2020-03-16 MED ORDER — ROCURONIUM BROMIDE 100 MG/10ML IV SOLN
INTRAVENOUS | Status: DC | PRN
  Administered 2020-03-16: 50 mg via INTRAVENOUS

## 2020-03-16 MED ORDER — ACETAMINOPHEN 500 MG PO TABS
ORAL_TABLET | ORAL | Status: AC
  Filled 2020-03-16: qty 2

## 2020-03-16 MED ORDER — FENTANYL CITRATE (PF) 100 MCG/2ML IJ SOLN
INTRAMUSCULAR | Status: AC
  Filled 2020-03-16: qty 2

## 2020-03-16 MED ORDER — OXYCODONE HCL 5 MG PO TABS: 5.0000 mg | ORAL_TABLET | Freq: Once | ORAL | Status: DC | PRN

## 2020-03-16 MED ORDER — ONDANSETRON HCL 4 MG/2ML IJ SOLN
INTRAMUSCULAR | Status: DC | PRN
  Administered 2020-03-16: 4 mg via INTRAVENOUS

## 2020-03-16 MED ORDER — LIDOCAINE HCL (CARDIAC) PF 100 MG/5ML IV SOSY
PREFILLED_SYRINGE | INTRAVENOUS | Status: DC | PRN
  Administered 2020-03-16: 60 mg via INTRAVENOUS

## 2020-03-16 MED ORDER — BUPIVACAINE HCL 0.5 % IJ SOLN
INTRAMUSCULAR | Status: DC | PRN
  Administered 2020-03-16: 8 mL

## 2020-03-16 MED ORDER — ONDANSETRON HCL 4 MG/2ML IJ SOLN
INTRAMUSCULAR | Status: AC
  Filled 2020-03-16: qty 2

## 2020-03-16 MED ORDER — PROPOFOL 10 MG/ML IV BOLUS
INTRAVENOUS | Status: AC
  Filled 2020-03-16: qty 20

## 2020-03-16 MED ORDER — POVIDONE-IODINE 10 % EX SWAB
2.0000 "application " | Freq: Once | CUTANEOUS | Status: AC
  Administered 2020-03-16: 2 via TOPICAL

## 2020-03-16 MED ORDER — ROCURONIUM BROMIDE 10 MG/ML (PF) SYRINGE
PREFILLED_SYRINGE | INTRAVENOUS | Status: AC
  Filled 2020-03-16: qty 10

## 2020-03-16 MED ORDER — KETOROLAC TROMETHAMINE 30 MG/ML IJ SOLN
INTRAMUSCULAR | Status: DC | PRN
  Administered 2020-03-16: 30 mg via INTRAVENOUS

## 2020-03-16 MED ORDER — GABAPENTIN 300 MG PO CAPS
300.0000 mg | ORAL_CAPSULE | ORAL | Status: AC
  Administered 2020-03-16: 300 mg via ORAL

## 2020-03-16 MED ORDER — GABAPENTIN 300 MG PO CAPS
ORAL_CAPSULE | ORAL | Status: AC
  Filled 2020-03-16: qty 1

## 2020-03-16 MED ORDER — MIDAZOLAM HCL 2 MG/2ML IJ SOLN
INTRAMUSCULAR | Status: DC | PRN
  Administered 2020-03-16: 2 mg via INTRAVENOUS

## 2020-03-16 MED ORDER — FENTANYL CITRATE (PF) 100 MCG/2ML IJ SOLN: 25.0000 ug | INTRAMUSCULAR | Status: DC | PRN

## 2020-03-16 MED ORDER — LIDOCAINE HCL (PF) 2 % IJ SOLN
INTRAMUSCULAR | Status: AC
  Filled 2020-03-16: qty 5

## 2020-03-16 MED ORDER — ORAL CARE MOUTH RINSE: 15.0000 mL | Freq: Once | OROMUCOSAL | Status: AC

## 2020-03-16 MED ORDER — DEXAMETHASONE SODIUM PHOSPHATE 10 MG/ML IJ SOLN
INTRAMUSCULAR | Status: AC
  Filled 2020-03-16: qty 1

## 2020-03-16 MED ORDER — FAMOTIDINE 20 MG PO TABS
ORAL_TABLET | ORAL | Status: AC
  Filled 2020-03-16: qty 1

## 2020-03-16 MED ORDER — MIDAZOLAM HCL 2 MG/2ML IJ SOLN
INTRAMUSCULAR | Status: AC
  Filled 2020-03-16: qty 2

## 2020-03-16 MED ORDER — PROPOFOL 10 MG/ML IV BOLUS
INTRAVENOUS | Status: DC | PRN
  Administered 2020-03-16: 100 mg via INTRAVENOUS

## 2020-03-16 MED ORDER — CHLORHEXIDINE GLUCONATE 0.12 % MT SOLN
15.0000 mL | Freq: Once | OROMUCOSAL | Status: AC
  Administered 2020-03-16: 15 mL via OROMUCOSAL

## 2020-03-16 MED ORDER — OXYCODONE HCL 5 MG/5ML PO SOLN: 5.0000 mg | Freq: Once | ORAL | Status: DC | PRN

## 2020-03-16 MED ORDER — KETOROLAC TROMETHAMINE 30 MG/ML IJ SOLN
INTRAMUSCULAR | Status: AC
  Filled 2020-03-16: qty 1

## 2020-03-16 MED ORDER — DEXAMETHASONE SODIUM PHOSPHATE 10 MG/ML IJ SOLN
INTRAMUSCULAR | Status: DC | PRN
  Administered 2020-03-16: 10 mg via INTRAVENOUS

## 2020-03-16 MED ORDER — SILVER NITRATE-POT NITRATE 75-25 % EX MISC
CUTANEOUS | Status: AC
  Filled 2020-03-16: qty 10

## 2020-03-16 MED ORDER — 0.9 % SODIUM CHLORIDE (POUR BTL) OPTIME
TOPICAL | Status: DC | PRN
  Administered 2020-03-16: 500 mL

## 2020-03-16 MED ORDER — FENTANYL CITRATE (PF) 250 MCG/5ML IJ SOLN
INTRAMUSCULAR | Status: DC | PRN
Start: 2020-03-16 — End: 2020-03-16
  Administered 2020-03-16 (×2): 50 ug via INTRAVENOUS
  Administered 2020-03-16: 100 ug via INTRAVENOUS

## 2020-03-16 MED ORDER — ACETAMINOPHEN 500 MG PO TABS
1000.0000 mg | ORAL_TABLET | ORAL | Status: AC
  Administered 2020-03-16: 1000 mg via ORAL

## 2020-03-16 MED ORDER — SUGAMMADEX SODIUM 200 MG/2ML IV SOLN
INTRAVENOUS | Status: DC | PRN
  Administered 2020-03-16: 200 mg via INTRAVENOUS

## 2020-03-16 SURGICAL SUPPLY — 39 items
APL PRP STRL LF DISP 70% ISPRP (MISCELLANEOUS) ×1
APR BAND 32X8 2 INCS BAND (Ring) ×1 IMPLANT
BLADE SURG SZ11 CARB STEEL (BLADE) ×3 IMPLANT
CATH ROBINSON RED A/P 16FR (CATHETERS) ×3 IMPLANT
CHLORAPREP W/TINT 26 (MISCELLANEOUS) ×3 IMPLANT
CLOSURE WOUND 1/4X4 (GAUZE/BANDAGES/DRESSINGS) ×1
COVER WAND RF STERILE (DRAPES) IMPLANT
DRSG TEGADERM 2-3/8X2-3/4 SM (GAUZE/BANDAGES/DRESSINGS) ×6 IMPLANT
GLOVE SURG SYN 8.0 (GLOVE) ×3 IMPLANT
GLOVE SURG SYN 8.0 PF PI (GLOVE) ×1 IMPLANT
GOWN STRL REUS W/ TWL LRG LVL3 (GOWN DISPOSABLE) ×1 IMPLANT
GOWN STRL REUS W/ TWL XL LVL3 (GOWN DISPOSABLE) ×1 IMPLANT
GOWN STRL REUS W/TWL LRG LVL3 (GOWN DISPOSABLE) ×3
GOWN STRL REUS W/TWL XL LVL3 (GOWN DISPOSABLE) ×3
GRASPER SUT TROCAR 14GX15 (MISCELLANEOUS) IMPLANT
KIT DISPOSABLE FALLOPE RING (Ring) ×3 IMPLANT
KIT PINK PAD W/HEAD ARE REST (MISCELLANEOUS) ×3
KIT PINK PAD W/HEAD ARM REST (MISCELLANEOUS) ×1 IMPLANT
KIT TURNOVER CYSTO (KITS) ×3 IMPLANT
LABEL OR SOLS (LABEL) ×3 IMPLANT
NDL HPO THNWL 1X22GA REG BVL (NEEDLE) ×1 IMPLANT
NEEDLE SAFETY 22GX1 (NEEDLE) ×3
NS IRRIG 500ML POUR BTL (IV SOLUTION) ×3 IMPLANT
PACK GYN LAPAROSCOPIC (MISCELLANEOUS) ×3 IMPLANT
PAD OB MATERNITY 4.3X12.25 (PERSONAL CARE ITEMS) ×3 IMPLANT
PAD PREP 24X41 OB/GYN DISP (PERSONAL CARE ITEMS) ×3 IMPLANT
SET TUBE SMOKE EVAC HIGH FLOW (TUBING) ×3 IMPLANT
SHEARS HARMONIC ACE PLUS 36CM (ENDOMECHANICALS) IMPLANT
SLEEVE ENDOPATH XCEL 5M (ENDOMECHANICALS) ×3 IMPLANT
SPONGE GAUZE 2X2 8PLY STER LF (GAUZE/BANDAGES/DRESSINGS) ×1
SPONGE GAUZE 2X2 8PLY STRL LF (GAUZE/BANDAGES/DRESSINGS) ×2 IMPLANT
STRIP CLOSURE SKIN 1/4X4 (GAUZE/BANDAGES/DRESSINGS) ×2 IMPLANT
SUT VIC AB 0 CT1 36 (SUTURE) ×3 IMPLANT
SUT VIC AB 2-0 UR6 27 (SUTURE) ×3 IMPLANT
SUT VIC AB 4-0 SH 27 (SUTURE) ×3
SUT VIC AB 4-0 SH 27XANBCTRL (SUTURE) ×1 IMPLANT
SWABSTK COMLB BENZOIN TINCTURE (MISCELLANEOUS) ×3 IMPLANT
TROCAR ENDO BLADELESS 11MM (ENDOMECHANICALS) IMPLANT
TROCAR XCEL NON-BLD 5MMX100MML (ENDOMECHANICALS) ×3 IMPLANT

## 2020-03-16 NOTE — Progress Notes (Signed)
Pt ready for L/S BTL . LAbs reviewed . Neg HCG  All questions answered . Proceed

## 2020-03-16 NOTE — Op Note (Signed)
NAME: Tonya Webb, Tonya Webb MEDICAL RECORD [id number] ACCOUNT [id number] DATE OF BIRTH:10/09/1990 FACILITY: [facility] LOCATION: [facility] PHYSICIAN:Nakenya Theall Cloyde Reams, MD  OPERATIVE REPORT  DATE OF PROCEDURE:  03/16/2020  PREOPERATIVE DIAGNOSIS:  Elective permanent sterilization.  POSTOPERATIVE DIAGNOSIS:  Elective permanent sterilization.  PROCEDURE:  Laparoscopic bilateral tubal ligation, right side Falope ring, left side cautery.  SURGEON:  Jennell Corner, MD  ANESTHESIA:  General endotracheal anesthesia.  INDICATIONS:  A 29 year old gravida 3, para 3 patient has elected for permanent sterilization.  DESCRIPTION OF PROCEDURE:  After adequate general endotracheal anesthesia, the patient was placed in dorsal supine position.  The patient's abdomen, perineum and vagina were prepped and draped in normal sterile fashion.  Timeout was performed.  A red  Robinson catheter was placed into the bladder and yielded 400 mL clear urine.  Sponge stick was placed into the vaginal vault to be used for uterine manipulation during the procedure.  Gloves were changed, gown was changed.  Attention was directed to the  patient's abdomen where a 5 mm infraumbilical incision was made after injecting with 0.5% Marcaine.  The laparoscope was advanced under direct visualization without difficulty.  The patient's abdomen was insufflated.  Second port placement was placed 2  cm superior to the symphysis pubis.  After injecting with 0.5% Marcaine, the trocar was advanced under direct visualization.   Anterior and posterior cul-de-sac appeared normal.  Fallopian tubes and ovaries appeared normal.  Upper abdomen appeared  normal.  Attention was directed to the patient's right fallopian tube, which was grasped at the isthmic portion of the fallopian tube and the Falope ring was applied, yielding a 1.5 cm portion of knuckle of fallopian tube.  Picture was taken.  Similar  procedure was repeated on the  patient's left side.  However, the Falope ring applier transected, the fallopian tube with placement.  Therefore, the Kleppinger cautery was brought up and the fallopian tube on the left was cauterized in 4 separate places.   Good hemostasis was noted.  Pressure was lowered.  Good hemostasis was noted and the patient's abdomen was decompressed.  Incisions were closed with interrupted 4-0 Vicryl suture.  Sterile dressing applied.  Sponge stick was removed.  COMPLICATIONS:  There were no complications.  ESTIMATED BLOOD LOSS:  Minimal.  INTRAOPERATIVE FLUIDS:  400 mL.  URINE OUTPUT:  400 mL.  DISPOSITION:  The patient was taken to recovery room in good condition.  VN/NUANCE  D:03/16/2020 T:03/16/2020 JOB:[id number]

## 2020-03-16 NOTE — Discharge Instructions (Signed)

## 2020-03-16 NOTE — Brief Op Note (Signed)
03/16/2020  11:58 AM  PATIENT:  Mervyn Gay  29 y.o. female  PRE-OPERATIVE DIAGNOSIS:  elective sterilization  POST-OPERATIVE DIAGNOSIS:  elective sterilization  PROCEDURE:  Procedure(s): LAPAROSCOPIC TUBAL LIGATION WITH FALOPE RING ON THE RIGHT, TUBAL CAUTERY TO LEFT (Bilateral)  SURGEON:  Surgeon(s) and Role:    * Jenevie Casstevens, Ihor Austin, MD - Primary  PHYSICIAN ASSISTANT: CST  ASSISTANTS: none   ANESTHESIA: GETA EBL:  5 mL  IOF 400 cc UO 400 cc BLOOD ADMINISTERED:none  DRAINS: none   LOCAL MEDICATIONS USED:  MARCAINE     SPECIMEN:  No Specimen  DISPOSITION OF SPECIMEN:  N/A  COUNTS:  YES  TOURNIQUET:  * No tourniquets in log *  DICTATION: .Other Dictation: Dictation Number verbal  PLAN OF CARE: Discharge to home after PACU  PATIENT DISPOSITION:  PACU - hemodynamically stable.   Delay start of Pharmacological VTE agent (>24hrs) due to surgical blood loss or risk of bleeding: not applicable

## 2020-03-16 NOTE — Transfer of Care (Signed)
Immediate Anesthesia Transfer of Care Note  Patient: Tonya Webb  Procedure(s) Performed: LAPAROSCOPIC TUBAL LIGATION WITH FALOPE RING ON THE RIGHT, TUBAL CAUTERY TO LEFT (Bilateral Abdomen)  Patient Location: PACU  Anesthesia Type:General  Level of Consciousness: awake, alert , oriented and patient cooperative  Airway & Oxygen Therapy: Patient Spontanous Breathing and Patient connected to face mask oxygen  Post-op Assessment: Report given to RN and Post -op Vital signs reviewed and stable  Post vital signs: Reviewed and stable  Last Vitals:  Vitals Value Taken Time  BP 132/84 03/16/20 1212  Temp    Pulse 99 03/16/20 1214  Resp 9 03/16/20 1214  SpO2 100 % 03/16/20 1214  Vitals shown include unvalidated device data.  Last Pain:  Vitals:   03/16/20 1212  TempSrc:   PainSc: (P) Asleep         Complications: No complications documented.

## 2020-03-16 NOTE — Anesthesia Postprocedure Evaluation (Signed)
Anesthesia Post Note  Patient: Tonya Webb  Procedure(s) Performed: LAPAROSCOPIC TUBAL LIGATION WITH FALOPE RING ON THE RIGHT, TUBAL CAUTERY TO LEFT (Bilateral Abdomen)  Patient location during evaluation: PACU Anesthesia Type: General Level of consciousness: awake and alert Pain management: pain level controlled Vital Signs Assessment: post-procedure vital signs reviewed and stable Respiratory status: spontaneous breathing, nonlabored ventilation, respiratory function stable and patient connected to nasal cannula oxygen Cardiovascular status: blood pressure returned to baseline and stable Postop Assessment: no apparent nausea or vomiting Anesthetic complications: no   No complications documented.   Last Vitals:  Vitals:   03/16/20 1242 03/16/20 1300  BP: 135/81 129/88  Pulse: 75 84  Resp: 16 16  Temp: 36.4 C (!) 36.1 C  SpO2: 100% 99%    Last Pain:  Vitals:   03/16/20 1300  TempSrc: Temporal  PainSc: 0-No pain                 Cleda Mccreedy [name]

## 2020-03-16 NOTE — Anesthesia Procedure Notes (Signed)
Procedure Name: Intubation Date/Time: 03/16/2020 11:17 AM Performed by: Eben Burow, CRNA Pre-anesthesia Checklist: Patient identified, Emergency Drugs available, Suction available and Patient being monitored Patient Re-evaluated:Patient Re-evaluated prior to induction Oxygen Delivery Method: Circle system utilized Preoxygenation: Pre-oxygenation with 100% oxygen Induction Type: IV induction Ventilation: Mask ventilation without difficulty Laryngoscope Size: Mac and 3 Grade View: Grade I Tube type: Oral Tube size: 7.0 mm Number of attempts: 1 Airway Equipment and Method: Stylet Placement Confirmation: ETT inserted through vocal cords under direct vision,  positive ETCO2 and breath sounds checked- equal and bilateral Secured at: 21 cm Tube secured with: Tape Dental Injury: Teeth and Oropharynx as per pre-operative assessment

## 2020-03-16 NOTE — Anesthesia Preprocedure Evaluation (Signed)
Anesthesia Evaluation  Patient identified by MRN, date of birth, ID band Patient awake    Reviewed: Allergy & Precautions, H&P , NPO status , Patient's Chart, lab work & pertinent test results  History of Anesthesia Complications Negative for: history of anesthetic complications  Airway Mallampati: II  TM Distance: >3 FB Neck ROM: full    Dental  (+) Chipped   Pulmonary neg pulmonary ROS, neg shortness of breath,    Pulmonary exam normal        Cardiovascular Exercise Tolerance: Good (-) angina(-) Past MI negative cardio ROS Normal cardiovascular exam     Neuro/Psych negative neurological ROS  negative psych ROS   GI/Hepatic negative GI ROS, Neg liver ROS, neg GERD  ,  Endo/Other  negative endocrine ROS  Renal/GU      Musculoskeletal   Abdominal   Peds  Hematology negative hematology ROS (+)   Anesthesia Other Findings Past Medical History: No date: Anemia     Comment:  during pregnancy   History reviewed. No pertinent surgical history.     Reproductive/Obstetrics negative OB ROS                             Anesthesia Physical Anesthesia Plan  ASA: II  Anesthesia Plan: General ETT   Post-op Pain Management:    Induction: Intravenous  PONV Risk Score and Plan: Ondansetron, Dexamethasone, Midazolam and Treatment may vary due to age or medical condition  Airway Management Planned: Oral ETT  Additional Equipment:   Intra-op Plan:   Post-operative Plan: Extubation in OR  Informed Consent: I have reviewed the patients History and Physical, chart, labs and discussed the procedure including the risks, benefits and alternatives for the proposed anesthesia with the patient or authorized representative who has indicated his/her understanding and acceptance.     Dental Advisory Given  Plan Discussed with: Anesthesiologist, CRNA and Surgeon  Anesthesia Plan Comments:  (History and consent via interpreter   Patient consented for risks of anesthesia including but not limited to:  - adverse reactions to medications - damage to eyes, teeth, lips or other oral mucosa - nerve damage due to positioning  - sore throat or hoarseness - Damage to heart, brain, nerves, lungs, other parts of body or loss of life  Patient voiced understanding.)        Anesthesia Quick Evaluation

## 2020-03-17 ENCOUNTER — Encounter: Payer: Self-pay | Admitting: Obstetrics and Gynecology

## 2020-09-12 ENCOUNTER — Other Ambulatory Visit: Payer: Self-pay

## 2020-09-12 ENCOUNTER — Encounter: Payer: Self-pay | Admitting: Intensive Care

## 2020-09-12 ENCOUNTER — Emergency Department
Admission: EM | Admit: 2020-09-12 | Discharge: 2020-09-12 | Disposition: A | Discharge status: Home / Self Care | Payer: Medicaid Other | Attending: Emergency Medicine | Admitting: Emergency Medicine

## 2020-09-12 DIAGNOSIS — H6502 Acute serous otitis media, left ear: Secondary | ICD-10-CM | POA: Diagnosis not present

## 2020-09-12 DIAGNOSIS — H9202 Otalgia, left ear: Secondary | ICD-10-CM | POA: Diagnosis present

## 2020-09-12 MED ORDER — NEOMYCIN-POLYMYXIN-HC 3.5-10000-1 OT SOLN: 3.0000 [drp] | Freq: Three times a day (TID) | OTIC | 0 refills | Status: AC

## 2020-09-12 MED ORDER — TRAMADOL HCL 50 MG PO TABS
50.0000 mg | ORAL_TABLET | Freq: Once | ORAL | Status: AC
  Administered 2020-09-12: 50 mg via ORAL
  Filled 2020-09-12: qty 1

## 2020-09-12 MED ORDER — FEXOFENADINE-PSEUDOEPHED ER 60-120 MG PO TB12: 1.0000 | ORAL_TABLET | Freq: Two times a day (BID) | ORAL | 0 refills | Status: AC

## 2020-09-12 MED ORDER — AMOXICILLIN 875 MG PO TABS: 875.0000 mg | ORAL_TABLET | Freq: Two times a day (BID) | ORAL | 0 refills | Status: AC

## 2020-09-12 MED ORDER — OXYCODONE-ACETAMINOPHEN 7.5-325 MG PO TABS
1.0000 | ORAL_TABLET | Freq: Four times a day (QID) | ORAL | 0 refills | Status: AC | PRN
Start: 2020-09-12 — End: ?

## 2020-09-12 NOTE — ED Provider Notes (Signed)
[facility] Emergency Department Provider Note   ____________________________________________   Event Date/Time   First MD Initiated Contact with Patient 09/12/20 0827     (approximate)  I have reviewed the triage vital signs and the nursing notes.   HISTORY  Chief Complaint Ear Pain    HPI Tonya Webb is a 30 y.o. female patient presents with acute onset of left ear pain which occurred last night.  Patient denies any provocative incident for complaint.  Patient denies any URI signs and symptoms.  Patient states his mild hearing loss.  Patient denies vertigo.  Patient rates the pain as a 10/10.  Patient described pain as "achy".  No palliative measure for complaint.  Denies recent travel or known contact with COVID-19.         Past Medical History:  Diagnosis Date  . Anemia    during pregnancy     Patient Active Problem List   Diagnosis Date Noted  . Labor and delivery indication for care or intervention 02/14/2020    Past Surgical History:  Procedure Laterality Date  . LAPAROSCOPIC TUBAL LIGATION Bilateral 03/16/2020   Procedure: LAPAROSCOPIC TUBAL LIGATION WITH FALOPE RING ON THE RIGHT, TUBAL CAUTERY TO LEFT;  Surgeon: Schermerhorn, Ihor Austin, MD;  Location: [facility] ORS;  Service: Gynecology;  Laterality: Bilateral;    Prior to Admission medications   Medication Sig Start Date End Date Taking? Authorizing Provider  amoxicillin (AMOXIL) 875 MG tablet Take 1 tablet (875 mg total) by mouth 2 (two) times daily. 09/12/20  Yes Joni Reining, PA-C  fexofenadine-pseudoephedrine (ALLEGRA-D) 60-120 MG 12 hr tablet Take 1 tablet by mouth 2 (two) times daily. 09/12/20  Yes Joni Reining, PA-C  neomycin-polymyxin-hydrocortisone (CORTISPORIN) OTIC solution Place 3 drops into the left ear 3 (three) times daily for 10 days. 09/12/20 09/22/20 Yes Joni Reining, PA-C  oxyCODONE-acetaminophen (PERCOCET) 7.5-325 MG tablet Take 1 tablet by mouth every 6  (six) hours as needed. 09/12/20  Yes Joni Reining, PA-C  docusate sodium (COLACE) 100 MG capsule Take 1 capsule (100 mg total) by mouth 2 (two) times daily. Patient not taking: Reported on 03/03/2020 02/15/20   McVey, Prudencio Pair, CNM  Prenatal Vit-Fe Fumarate-FA (PRENATAL MULTIVITAMIN) TABS tablet Take 1 tablet by mouth daily at 12 noon. 02/15/20   McVey, Prudencio Pair, CNM  witch hazel-glycerin (TUCKS) pad Apply 1 application topically continuous. Patient not taking: Reported on 03/03/2020 02/15/20   McVey, Prudencio Pair, CNM    Allergies Patient has no known allergies.  History reviewed. No pertinent family history.  Social History Social History   Tobacco Use  . Smoking status: Never Smoker  . Smokeless tobacco: Never Used  Substance Use Topics  . Alcohol use: No  . Drug use: No    Review of Systems Constitutional: No fever/chills Eyes: No visual changes. ENT: No sore throat.  Left ear pain Cardiovascular: Denies chest pain. Respiratory: Denies shortness of breath. Gastrointestinal: No abdominal pain.  No nausea, no vomiting.  No diarrhea.  No constipation. Genitourinary: Negative for dysuria. Musculoskeletal: Negative for back pain. Skin: Negative for rash. Neurological: Negative for headaches, focal weakness or numbness.  ____________________________________________   PHYSICAL EXAM:  VITAL SIGNS: ED Triage Vitals  Enc Vitals Group     BP 09/12/20 0746 122/86     Pulse Rate 09/12/20 0746 80     Resp 09/12/20 0746 16     Temp 09/12/20 0746 98.5 F (36.9 C)     Temp Source 09/12/20  0746 Oral     SpO2 09/12/20 0746 100 %     Weight 09/12/20 0748 130 lb (59 kg)     Height 09/12/20 0748 4\' 11"  (1.499 m)     Head Circumference --      Peak Flow --      Pain Score 09/12/20 0747 10     Pain Loc --      Pain Edu? --      Excl. in [place]? --    Constitutional: Alert and oriented. Well appearing and in no acute distress. Eyes: Conjunctivae are normal. PERRL. EOMI. Head:  Atraumatic. Nose: No congestion/rhinnorhea. EARS: Edematous and erythematous left TM. Mouth/Throat: Mucous membranes are moist.  Oropharynx non-erythematous. Neck: No stridor.  Hematological/Lymphatic/Immunilogical: No cervical lymphadenopathy. Cardiovascular: Normal rate, regular rhythm. Grossly normal heart sounds.  Good peripheral circulation. Respiratory: Normal respiratory effort.  No retractions. Lungs CTAB. Gastrointestinal: Soft and nontender. No distention. No abdominal bruits. No CVA tenderness. Musculoskeletal: No lower extremity tenderness nor edema.  No joint effusions. Neurologic:  Normal speech and language. No gross focal neurologic deficits are appreciated. No gait instability. Skin:  Skin is warm, dry and intact. No rash noted. Psychiatric: Mood and affect are normal. Speech and behavior are normal.  ____________________________________________   LABS (all labs ordered are listed, but only abnormal results are displayed)  Labs Reviewed - No data to display ____________________________________________  EKG   ____________________________________________  RADIOLOGY I, 11/12/20, personally viewed and evaluated these images (plain radiographs) as part of my medical decision making, as well as reviewing the written report by the radiologist.  ED MD interpretation:    Official radiology report(s): No results found.  ____________________________________________   PROCEDURES  Procedure(s) performed (including Critical Care):  Procedures   ____________________________________________   INITIAL IMPRESSION / ASSESSMENT AND PLAN / ED COURSE  As part of my medical decision making, I reviewed the following data within the electronic MEDICAL RECORD NUMBER         Patient presents with acute onset left ear pain.  Patient denies hearing loss or vertigo.  Patient physical exam consistent for left otitis media.  Patient given discharge care instruction work note.   Advised to take medication as directed.  Patient advised follow-up PCP if no improvement in 3 days.  Return back to ED if condition worsens.      ____________________________________________   FINAL CLINICAL IMPRESSION(S) / ED DIAGNOSES  Final diagnoses:  Non-recurrent acute serous otitis media of left ear     ED Discharge Orders         Ordered    amoxicillin (AMOXIL) 875 MG tablet  2 times daily        09/12/20 0834    fexofenadine-pseudoephedrine (ALLEGRA-D) 60-120 MG 12 hr tablet  2 times daily        09/12/20 0834    oxyCODONE-acetaminophen (PERCOCET) 7.5-325 MG tablet  Every 6 hours PRN        09/12/20 0834    neomycin-polymyxin-hydrocortisone (CORTISPORIN) OTIC solution  3 times daily        09/12/20 11/12/20          *Please note:  Tonya Webb was evaluated in Emergency Department on 09/12/2020 for the symptoms described in the history of present illness. She was evaluated in the context of the global COVID-19 pandemic, which necessitated consideration that the patient might be at risk for infection with the SARS-CoV-2 virus that causes COVID-19. Institutional protocols and algorithms that pertain to the evaluation of  patients at risk for COVID-19 are in a state of rapid change based on information released by regulatory bodies including the CDC and federal and state organizations. These policies and algorithms were followed during the patient's care in the ED.  Some ED evaluations and interventions may be delayed as a result of limited staffing during and the pandemic.*   Note:  This document was prepared using Dragon voice recognition software and may include unintentional dictation errors.    Joni Reining, PA-C 09/12/20 1610    Sharyn Creamer, MD 09/14/20 1139

## 2020-09-12 NOTE — ED Notes (Signed)
Pt states her husband was picking her up after taking pain medicine

## 2020-09-12 NOTE — ED Notes (Signed)
Pt with c/o left ear pain starting last night. Pt has taken ibuprofen and it has not helped per pt. Pt denies drainage from ear.

## 2020-09-12 NOTE — ED Triage Notes (Signed)
Patient c/o left ear pain that started last night

## 2020-09-12 NOTE — Discharge Instructions (Signed)
Follow discharge care instruction take medication as directed. °

## 2021-01-02 IMAGING — US US OB FOLLOW-UP
1 series · 13 of 28 positions shown · non-contrast
Comparison: none

CLINICAL DATA: Follow-up incomplete fetal anatomic survey and fetal
growth.

EXAM:
OBSTETRIC 14+ WK ULTRASOUND FOLLOW-UP

[Series 1: us ob follow-up · 0.25mm/px · 13 of 52 slices shown]
[im 2/52]
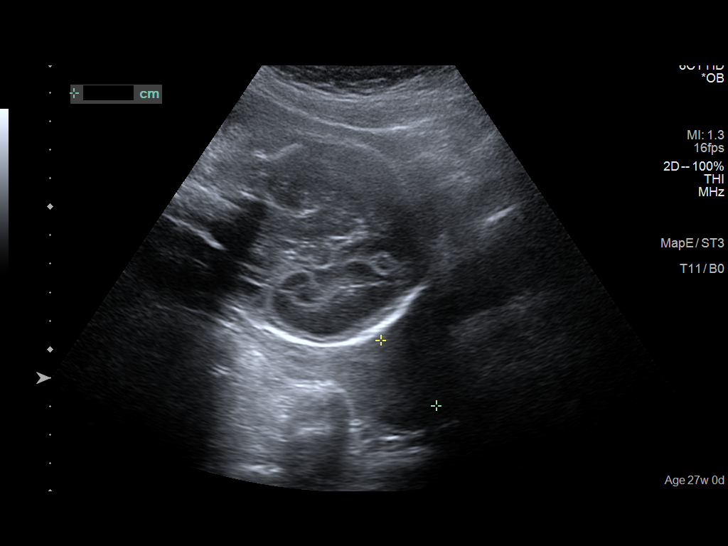
[im 6/52]
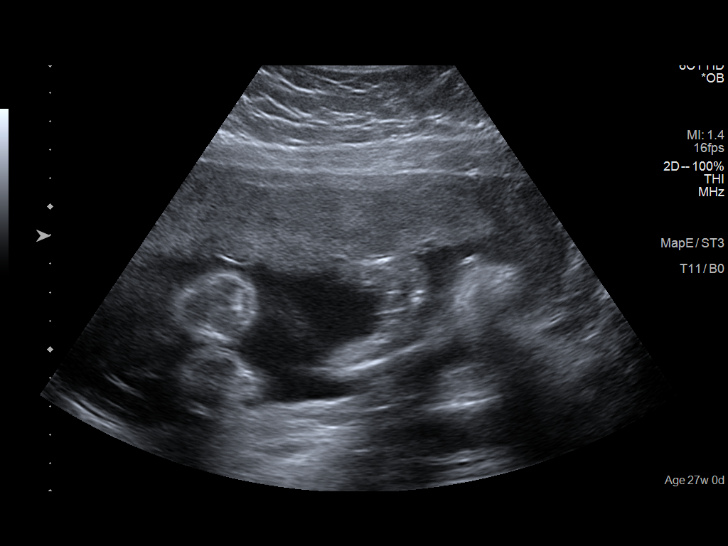
[im 10/52]
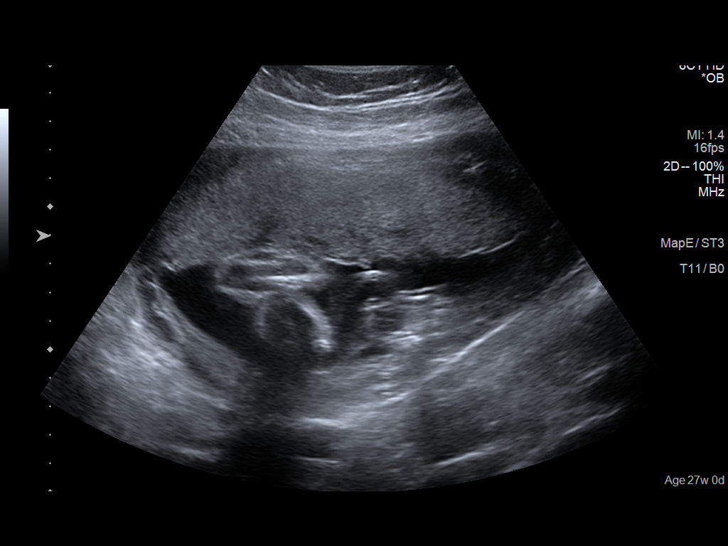
[im 14/52]
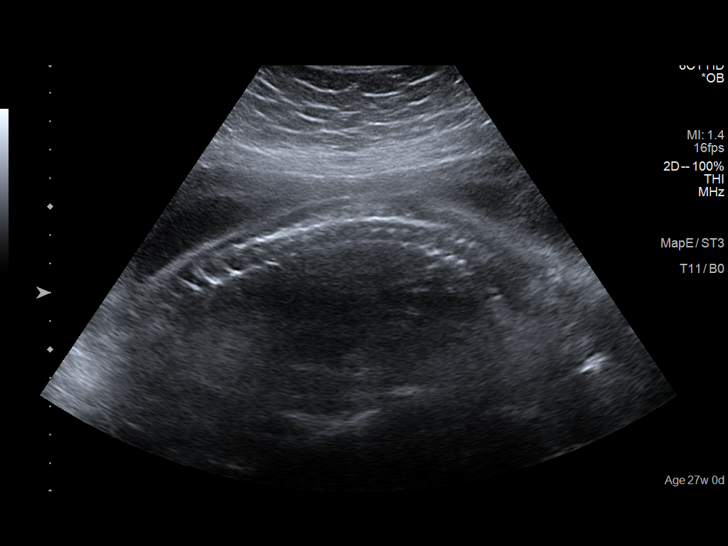
[im 18/52]
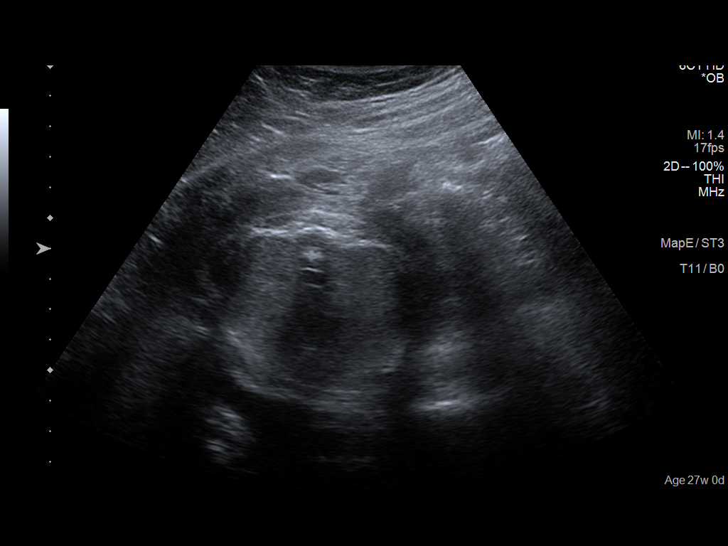
[im 21/52]
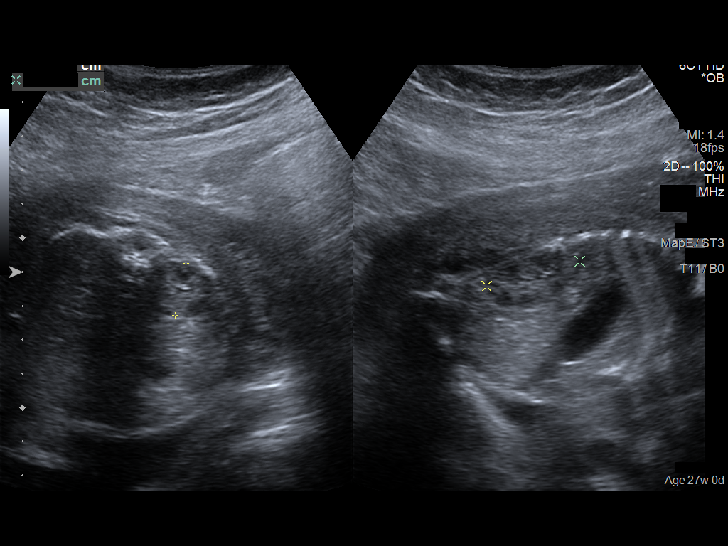
[im 27/52]
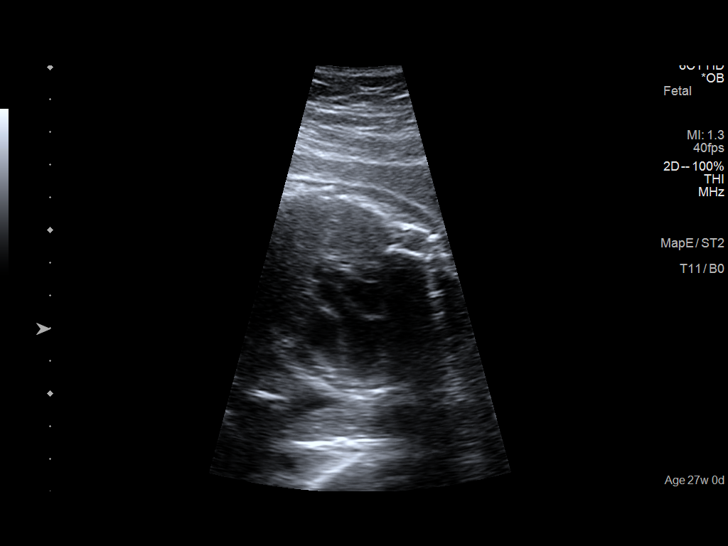
[im 31/52]
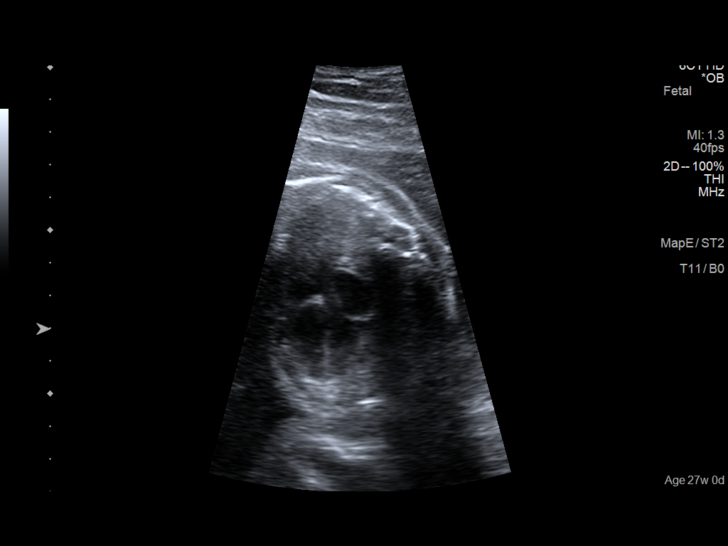
[im 35/52]
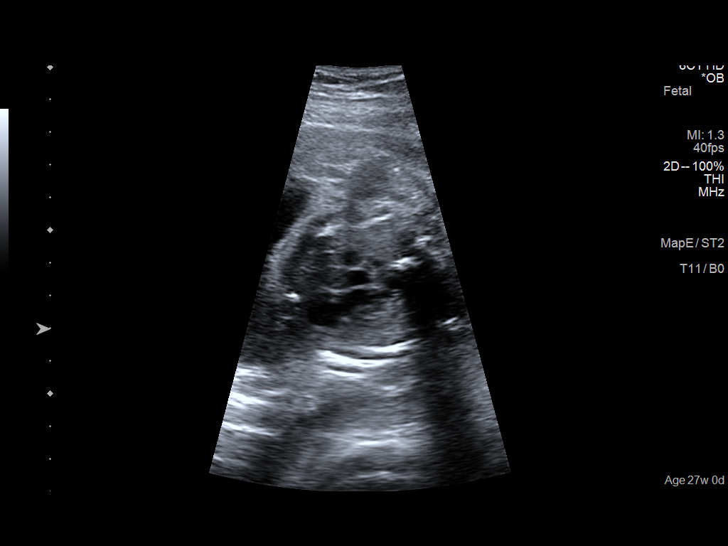
[im 38/52]
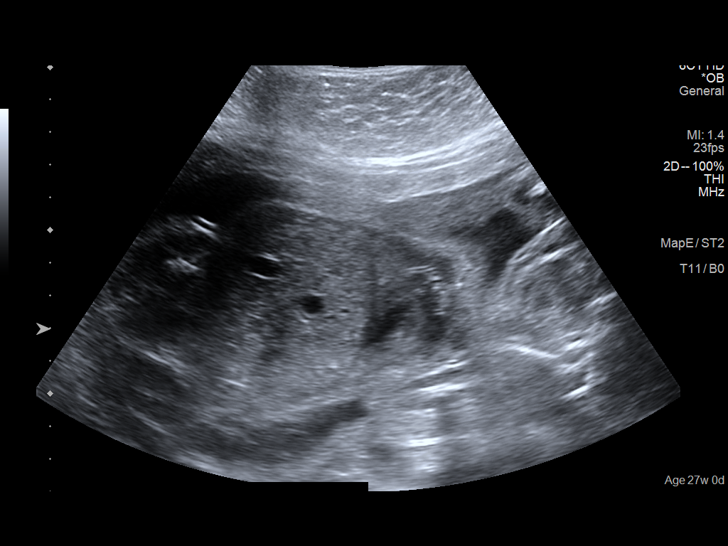
[im 42/52]
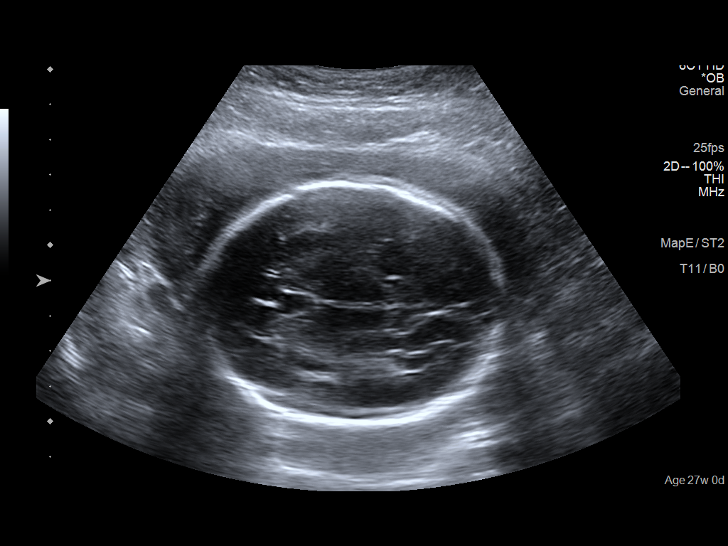
[im 46/52]
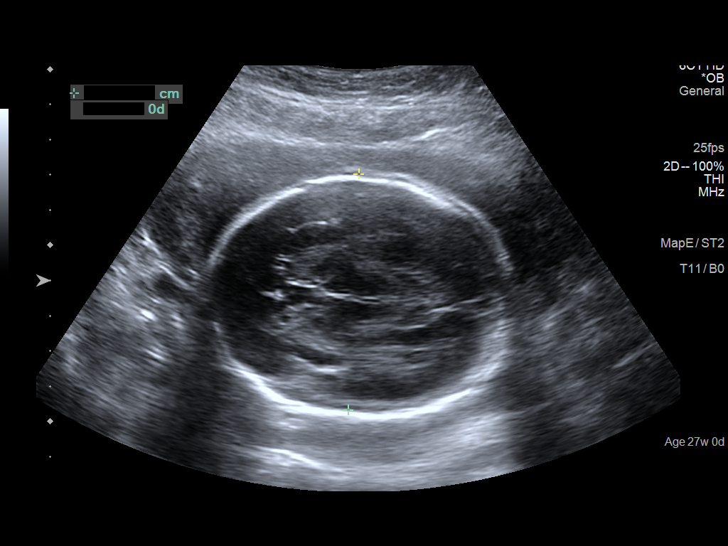
[im 50/52]
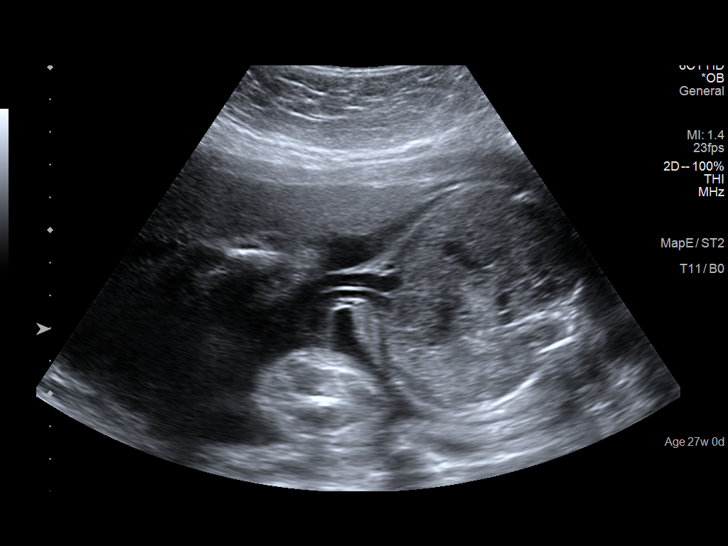

[13 of 28 positions shown; findings below may reference images not displayed]

FINDINGS: Number of Fetuses: 1

Heart Rate:  141 bpm

Movement: Yes

Presentation: Cephalic

Previa: No

Placental Location: Anterior

Amniotic Fluid (Subjective): Within normal limits

Amniotic Fluid (Objective):

Vertical pocket 4.5cm

FETAL BIOMETRY

BPD:  6.7cm 26w 6d

HC:    24.9cm 27w 0d

AC:    22.7cm 27w 0d

FL:    5.1cm 27w 2d

Current Mean GA: 27w 1d US EDC: 02/19/2020

Assigned GA: 27w 0d Assigned EDC: 02/20/2020

FETAL ANATOMY

Lateral Ventricles: Appears normal

Thalami/CSP: Appears normal

Posterior Fossa: Appears normal

Nuchal Region: Appears normal

Upper Lip: Appears normal

Spine: Appears normal

4 Chamber Heart on Left: Appears normal

LVOT: Appears normal

RVOT: Appears normal

Stomach on Left: Appears normal

3 Vessel Cord: Previously seen

Cord Insertion site: Appears normal

Kidneys: Appears normal

Bladder: Appears normal

Extremities: Previously seen

Sex: Previously Seen

Technical Limitations: Advanced gestational age

Maternal Findings:

Cervix:  3.0 cm TA
IMPRESSION: Assigned GA currently 27 weeks 0 days.  Appropriate fetal growth.

Cardiac outflow tracts are seen on today's exam. No abnormalities
seen involving visualized fetal anatomy.

## 2022-04-01 ENCOUNTER — Encounter: Payer: Self-pay | Admitting: *Deleted

## 2022-04-01 ENCOUNTER — Emergency Department
Admission: EM | Admit: 2022-04-01 | Discharge: 2022-04-02 | Disposition: A | Discharge status: Home / Self Care | Payer: Medicaid Other | Attending: Student | Admitting: Student

## 2022-04-01 ENCOUNTER — Other Ambulatory Visit: Payer: Self-pay

## 2022-04-01 DIAGNOSIS — L509 Urticaria, unspecified: Secondary | ICD-10-CM | POA: Insufficient documentation

## 2022-04-01 DIAGNOSIS — R21 Rash and other nonspecific skin eruption: Secondary | ICD-10-CM | POA: Diagnosis present

## 2022-04-01 MED ORDER — CETIRIZINE HCL 10 MG PO TABS: 10.0000 mg | ORAL_TABLET | Freq: Every day | ORAL | 0 refills | Status: AC

## 2022-04-01 MED ORDER — CETIRIZINE HCL 5 MG/5ML PO SOLN: 5.0000 mg | Freq: Once | ORAL | Status: DC

## 2022-04-01 MED ORDER — LORATADINE 10 MG PO TABS: 10.0000 mg | ORAL_TABLET | Freq: Once | ORAL | Status: DC

## 2022-04-01 NOTE — ED Notes (Signed)
Patient discharged from triage

## 2022-04-01 NOTE — ED Notes (Signed)
First Nurse Note: Pt to ED via POV for generalized rash.

## 2022-04-01 NOTE — ED Triage Notes (Signed)
Patient c/o scattered rash on torso and arms and face for approximately 7days. Patient states she has taken some Benadryl. Patient denies new foods, lotions or detergents. Jenna PA-C at bedside.

## 2022-04-01 NOTE — Discharge Instructions (Addendum)
You may take the medications as prescribed. Return to the emergency department immediately if you develop swelling to your lips or tongue, trouble swallowing or breathing, worsening rash, or any other concerns. Please discontinue anything in your environment that may be new. It was a pleasure caring for you today.

## 2022-04-01 NOTE — ED Provider Notes (Signed)
[facility] Provider Note    None    (approximate)   History   Rash   HPI  Tonya Webb is a 31 y.o. female with no reported past medical history presents today for evaluation of rash to her torso for the past 1 week.  She reports that it is itchy.  She denies any known new exposures, no new medications, detergents, soaps noted.  She denies any trouble breathing or swallowing.  She has not had any tongue or lip swelling.  She denies any abdominal pain, nausea, vomiting, diarrhea.  She has not had any involvement of her face or mouth or genital area.  She has not taken anything for her symptoms.  Patient Active Problem List   Diagnosis Date Noted   Labor and delivery indication for care or intervention 02/14/2020          Physical Exam   Triage Vital Signs: ED Triage Vitals  Enc Vitals Group     BP 04/01/22 1132 119/82     Pulse Rate 04/01/22 1132 65     Resp 04/01/22 1132 14     Temp 04/01/22 1132 99 F (37.2 C)     Temp src --      SpO2 04/01/22 1132 99 %     Weight 04/01/22 1133 147 lb (66.7 kg)     Height 04/01/22 1133 4\' 11"  (1.499 m)     Head Circumference --      Peak Flow --      Pain Score --      Pain Loc --      Pain Edu? --      Excl. in [place]? --     Most recent vital signs: Vitals:   04/01/22 1132  BP: 119/82  Pulse: 65  Resp: 14  Temp: 99 F (37.2 C)  SpO2: 99%    Physical Exam Vitals and nursing note reviewed.  Constitutional:      General: Awake and alert. No acute distress.    Appearance: Normal appearance. The patient is normal weight.  HENT:     Head: Normocephalic and atraumatic.     Mouth: Mucous membranes are moist.  No tongue or lip swelling Eyes:     General: PERRL. Normal EOMs        Right eye: No discharge.        Left eye: No discharge.     Conjunctiva/sclera: Conjunctivae normal.  Cardiovascular:     Rate and Rhythm: Normal rate and regular rhythm.     Pulses: Normal pulses.   Pulmonary:     Effort: Pulmonary effort is normal. No respiratory distress.     Breath sounds: Normal breath sounds.  No wheezing Abdominal:     Abdomen is soft. There is no abdominal tenderness. No rebound or guarding. No distention. Musculoskeletal:        General: No swelling. Normal range of motion.     Cervical back: Normal range of motion and neck supple.  Skin:    General: Skin is warm and dry.     Capillary Refill: Capillary refill takes less than 2 seconds.     Findings: Scattered urticaria noted to abdomen and back.  No involvement of limbs or face.  No open wounds, blisters, bullae, or excoriations Neurological:     Mental Status: The patient is awake and alert.      ED Results / Procedures / Treatments   Labs (all labs ordered are listed, but only  abnormal results are displayed) Labs Reviewed - No data to display   EKG     RADIOLOGY     PROCEDURES:  Critical Care performed:   Procedures   MEDICATIONS ORDERED IN ED: Medications - No data to display    IMPRESSION / MDM / ASSESSMENT AND PLAN / ED COURSE  I reviewed the triage vital signs and the nursing notes.   Differential diagnosis includes, but is not limited to, contact dermatitis, allergic reaction, urticaria.  No tongue or lip swelling or dysphagia or voice change or trouble breathing to suggest angioedema or anaphylaxis.  No multisystem organ involvement.  Patient was given cetirizine to help with her symptoms.  She was advised to pay close attention to possible new environmental exposures so that she can try to illuminate these.  She has normal oxygen saturation on room air and demonstrates no acute distress.  She has no known allergies.  We discussed return precautions in the importance of close outpatient follow-up.  Patient understands and agrees with plan.  She was discharged in stable condition.   Patient's presentation is most consistent with acute illness / injury with system  symptoms.      FINAL CLINICAL IMPRESSION(S) / ED DIAGNOSES   Final diagnoses:  Urticaria     Rx / DC Orders   ED Discharge Orders          Ordered    cetirizine (ZYRTEC ALLERGY) 10 MG tablet  Daily        04/01/22 1138             Note:  This document was prepared using Dragon voice recognition software and may include unintentional dictation errors.   Keturah Shavers 04/01/22 1314    Jene Every, MD 04/01/22 1330
